# Patient Record
Sex: Male | Born: 1935 | Race: White | Hispanic: No | Marital: Married | Smoking: Former smoker
Health system: Southern US, Community
[De-identification: ages and names within clinical notes are randomized; demographics above are authoritative.]

## PROBLEM LIST (undated history)

## (undated) DIAGNOSIS — Z950 Presence of cardiac pacemaker: Secondary | ICD-10-CM

## (undated) DIAGNOSIS — N189 Chronic kidney disease, unspecified: Secondary | ICD-10-CM

## (undated) DIAGNOSIS — I7300 Raynaud's syndrome without gangrene: Secondary | ICD-10-CM

## (undated) DIAGNOSIS — F419 Anxiety disorder, unspecified: Secondary | ICD-10-CM

## (undated) DIAGNOSIS — I719 Aortic aneurysm of unspecified site, without rupture: Secondary | ICD-10-CM

## (undated) DIAGNOSIS — R55 Syncope and collapse: Secondary | ICD-10-CM

## (undated) DIAGNOSIS — I499 Cardiac arrhythmia, unspecified: Secondary | ICD-10-CM

## (undated) DIAGNOSIS — I10 Essential (primary) hypertension: Secondary | ICD-10-CM

## (undated) DIAGNOSIS — E785 Hyperlipidemia, unspecified: Secondary | ICD-10-CM

## (undated) DIAGNOSIS — I639 Cerebral infarction, unspecified: Secondary | ICD-10-CM

## (undated) DIAGNOSIS — K219 Gastro-esophageal reflux disease without esophagitis: Secondary | ICD-10-CM

## (undated) DIAGNOSIS — F329 Major depressive disorder, single episode, unspecified: Secondary | ICD-10-CM

## (undated) DIAGNOSIS — I714 Abdominal aortic aneurysm, without rupture, unspecified: Secondary | ICD-10-CM

## (undated) DIAGNOSIS — C801 Malignant (primary) neoplasm, unspecified: Secondary | ICD-10-CM

## (undated) DIAGNOSIS — F32A Depression, unspecified: Secondary | ICD-10-CM

## (undated) HISTORY — DX: Anxiety disorder, unspecified: F41.9

## (undated) HISTORY — DX: Abdominal aortic aneurysm, without rupture: I71.4

## (undated) HISTORY — DX: Essential (primary) hypertension: I10

## (undated) HISTORY — DX: Major depressive disorder, single episode, unspecified: F32.9

## (undated) HISTORY — DX: Chronic kidney disease, unspecified: N18.9

## (undated) HISTORY — PX: INSERT / REPLACE / REMOVE PACEMAKER: SUR710

## (undated) HISTORY — DX: Cerebral infarction, unspecified: I63.9

## (undated) HISTORY — PX: TONSILLECTOMY: SUR1361

## (undated) HISTORY — PX: OTHER SURGICAL HISTORY: SHX169

## (undated) HISTORY — DX: Depression, unspecified: F32.A

## (undated) HISTORY — PX: CHOLECYSTECTOMY: SHX55

## (undated) HISTORY — DX: Malignant (primary) neoplasm, unspecified: C80.1

## (undated) HISTORY — DX: Abdominal aortic aneurysm, without rupture, unspecified: I71.40

## (undated) HISTORY — DX: Hyperlipidemia, unspecified: E78.5

## (undated) HISTORY — DX: Presence of cardiac pacemaker: Z95.0

---

## 1998-03-05 ENCOUNTER — Other Ambulatory Visit: Admission: RE | Admit: 1998-03-05 | Discharge: 1998-03-05 | Payer: Self-pay | Admitting: Urology

## 1998-03-16 ENCOUNTER — Ambulatory Visit (HOSPITAL_COMMUNITY): Admission: RE | Admit: 1998-03-16 | Discharge: 1998-03-16 | Payer: Self-pay | Admitting: Urology

## 1998-04-02 ENCOUNTER — Encounter: Admission: RE | Admit: 1998-04-02 | Discharge: 1998-07-01 | Payer: Self-pay | Admitting: Radiation Oncology

## 1998-07-05 ENCOUNTER — Encounter: Admission: RE | Admit: 1998-07-05 | Discharge: 1998-10-03 | Payer: Self-pay | Admitting: Radiation Oncology

## 1998-07-21 ENCOUNTER — Encounter: Payer: Self-pay | Admitting: Urology

## 1998-07-24 ENCOUNTER — Encounter: Payer: Self-pay | Admitting: Urology

## 1998-07-24 ENCOUNTER — Ambulatory Visit (HOSPITAL_COMMUNITY): Admission: RE | Admit: 1998-07-24 | Discharge: 1998-07-25 | Payer: Self-pay | Admitting: Urology

## 1998-07-28 ENCOUNTER — Encounter: Payer: Self-pay | Admitting: Urology

## 1998-08-14 ENCOUNTER — Encounter: Payer: Self-pay | Admitting: Radiation Oncology

## 2001-10-24 ENCOUNTER — Inpatient Hospital Stay (HOSPITAL_COMMUNITY): Admission: EM | Admit: 2001-10-24 | Discharge: 2001-10-26 | Payer: Self-pay | Admitting: Emergency Medicine

## 2005-11-04 ENCOUNTER — Encounter: Admission: RE | Admit: 2005-11-04 | Discharge: 2005-11-04 | Payer: Self-pay | Admitting: Neurology

## 2006-08-03 ENCOUNTER — Emergency Department (HOSPITAL_COMMUNITY): Admission: EM | Admit: 2006-08-03 | Discharge: 2006-08-04 | Payer: Self-pay | Admitting: Emergency Medicine

## 2008-04-22 ENCOUNTER — Ambulatory Visit: Payer: Self-pay | Admitting: Vascular Surgery

## 2008-09-26 HISTORY — PX: CYSTOSCOPY: SUR368

## 2008-09-26 HISTORY — PX: OTHER SURGICAL HISTORY: SHX169

## 2008-09-30 ENCOUNTER — Encounter: Admission: RE | Admit: 2008-09-30 | Discharge: 2008-09-30 | Payer: Self-pay | Admitting: Vascular Surgery

## 2008-09-30 ENCOUNTER — Ambulatory Visit: Payer: Self-pay | Admitting: Vascular Surgery

## 2008-10-22 ENCOUNTER — Inpatient Hospital Stay (HOSPITAL_COMMUNITY): Admission: RE | Admit: 2008-10-22 | Discharge: 2008-10-24 | Payer: Self-pay | Admitting: Vascular Surgery

## 2008-10-22 ENCOUNTER — Ambulatory Visit: Payer: Self-pay | Admitting: Vascular Surgery

## 2008-11-04 ENCOUNTER — Ambulatory Visit: Payer: Self-pay | Admitting: Vascular Surgery

## 2008-11-25 ENCOUNTER — Ambulatory Visit: Payer: Self-pay | Admitting: Vascular Surgery

## 2008-11-25 ENCOUNTER — Encounter: Admission: RE | Admit: 2008-11-25 | Discharge: 2008-11-25 | Payer: Self-pay | Admitting: Vascular Surgery

## 2009-03-03 ENCOUNTER — Encounter: Admission: RE | Admit: 2009-03-03 | Discharge: 2009-03-03 | Payer: Self-pay | Admitting: Vascular Surgery

## 2009-03-03 ENCOUNTER — Ambulatory Visit: Payer: Self-pay | Admitting: Vascular Surgery

## 2009-06-09 ENCOUNTER — Ambulatory Visit: Payer: Self-pay | Admitting: Vascular Surgery

## 2009-07-09 ENCOUNTER — Inpatient Hospital Stay (HOSPITAL_COMMUNITY): Admission: AD | Admit: 2009-07-09 | Discharge: 2009-07-13 | Payer: Self-pay

## 2009-12-08 ENCOUNTER — Ambulatory Visit: Payer: Self-pay | Admitting: Vascular Surgery

## 2009-12-08 ENCOUNTER — Encounter: Admission: RE | Admit: 2009-12-08 | Discharge: 2009-12-08 | Payer: Self-pay | Admitting: Vascular Surgery

## 2009-12-16 ENCOUNTER — Encounter: Admission: RE | Admit: 2009-12-16 | Discharge: 2009-12-16 | Payer: Self-pay | Admitting: Vascular Surgery

## 2010-03-04 ENCOUNTER — Ambulatory Visit (HOSPITAL_COMMUNITY)
Admission: RE | Admit: 2010-03-04 | Discharge: 2010-03-05 | Payer: Self-pay | Source: Home / Self Care | Admitting: Vascular Surgery

## 2010-05-04 ENCOUNTER — Ambulatory Visit: Payer: Self-pay | Admitting: Vascular Surgery

## 2010-05-04 ENCOUNTER — Encounter: Admission: RE | Admit: 2010-05-04 | Discharge: 2010-05-04 | Payer: Self-pay | Admitting: Interventional Radiology

## 2010-07-05 IMAGING — CT CT ANGIO PELVIS
2 of 5 series · 15 of 46 positions shown, 17 images · IV contrast (80ML OMNI 350)
Comparison: 08/04/2006.

CTA ABDOMEN

CLINICAL DATA: Abdominal aortic aneurysm.

CT ANGIOGRAPHY OF ABDOMEN AND PELVIS WITHOUT AND/OR WITH CONTRAST-
PRESTENT PROTOCOL
TECHNIQUE: Multidetector CT imaging of the abdomen and pelvis was
performed before and during bolus injection of intravenous
contrast.  Multiplanar CT angiographic image reconstructions
including MIPs were also generated to evaluate the vascular
anatomy.
Contrast: 80 ml Hmnipaque-G99

[Series 4: angio · axial · 0.78mm/px · z∈[-436,-4]mm · 12 of 193 slices shown, 14 images]
[im 10/193  soft-tissue]
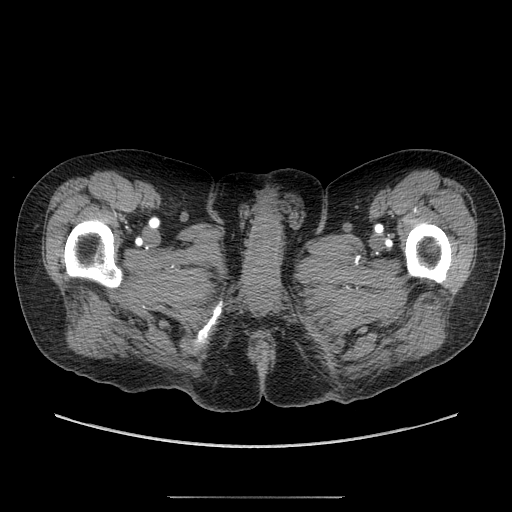
[im 10/193  bone]
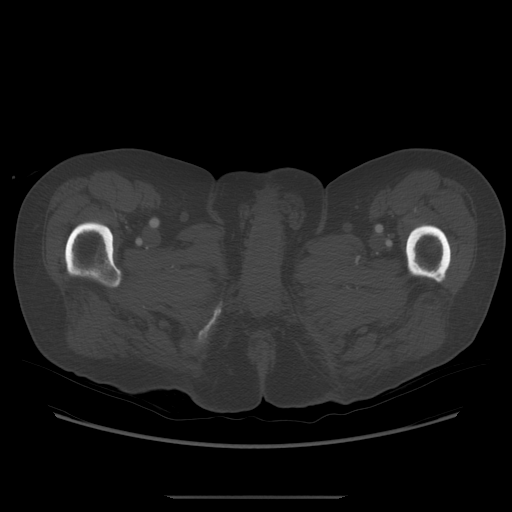
[im 29/193  soft-tissue]
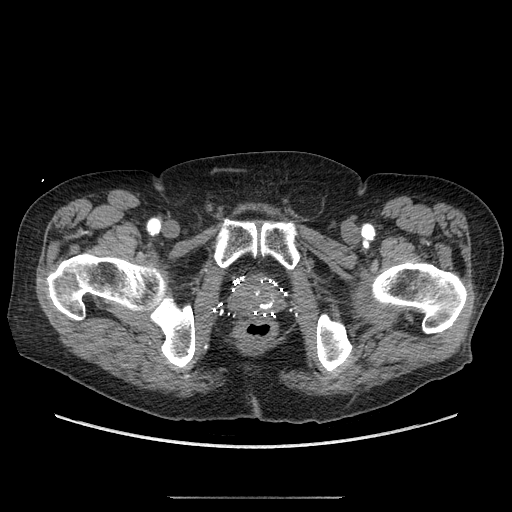
[im 39/193  soft-tissue]
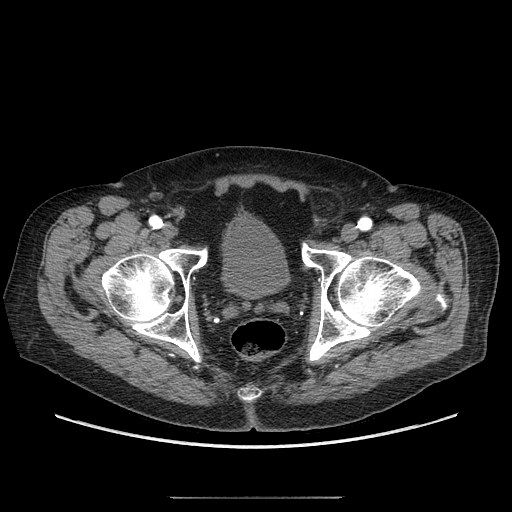
[im 58/193  soft-tissue]
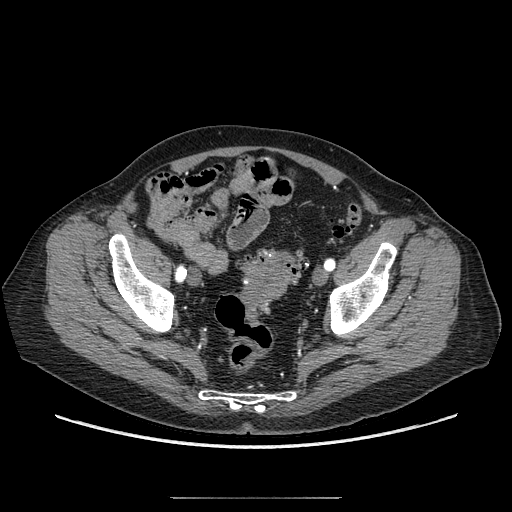
[im 77/193  soft-tissue]
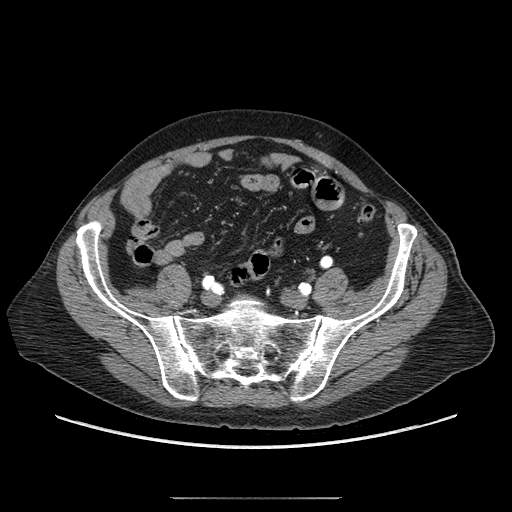
[im 87/193  soft-tissue]
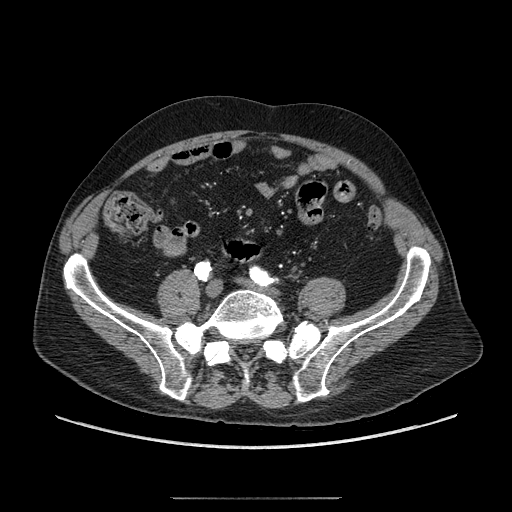
[im 106/193  soft-tissue]
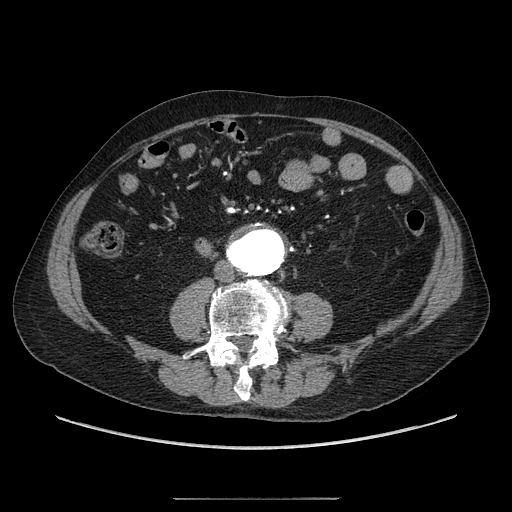
[im 116/193  soft-tissue]
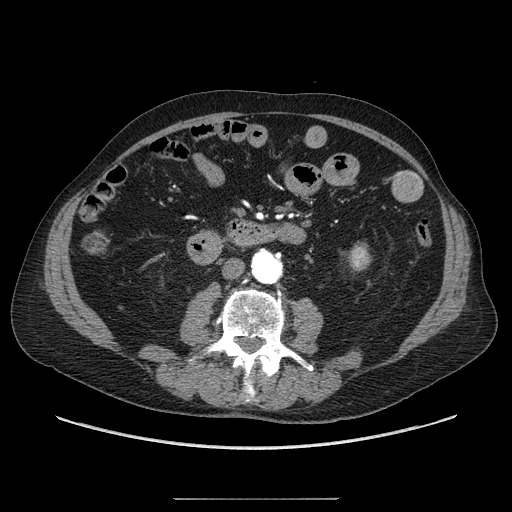
[im 135/193  soft-tissue]
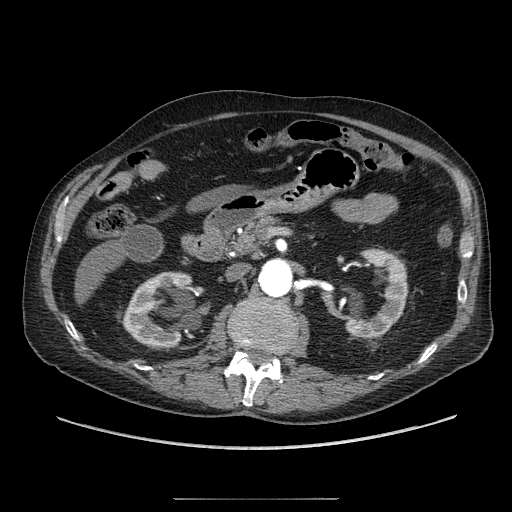
[im 135/193  bone]
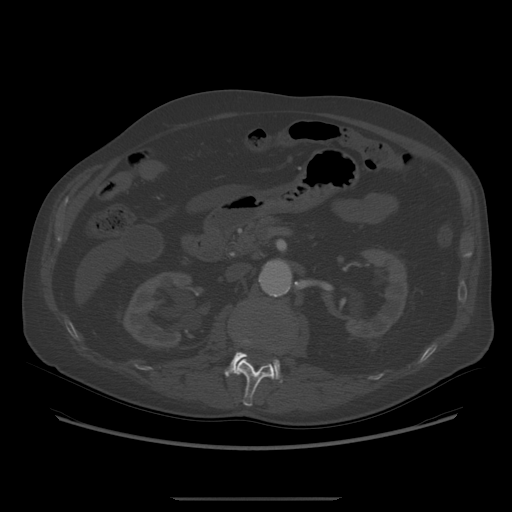
[im 154/193  soft-tissue]
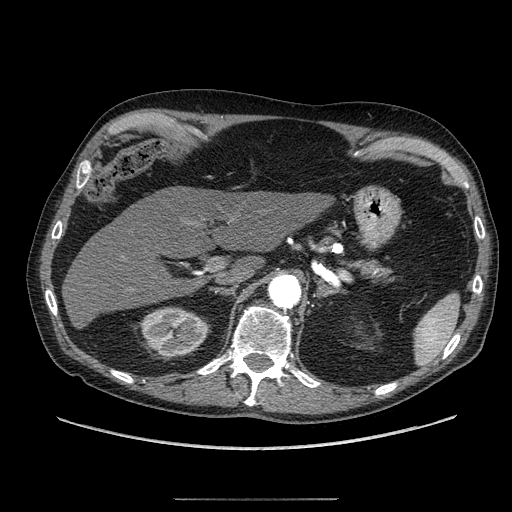
[im 164/193  soft-tissue]
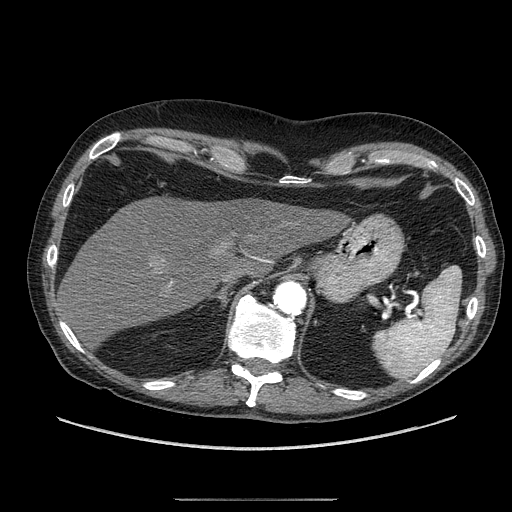
[im 183/193  soft-tissue]
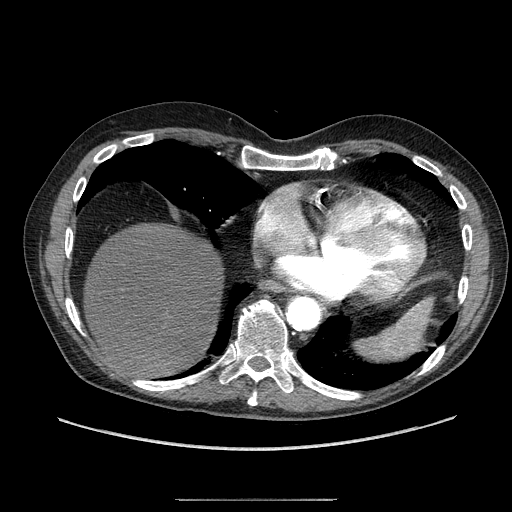

[Series 602: sagittal body · sagittal · 0.94mm/px · 3 of 160 slices shown]
[im 54/160  soft-tissue]
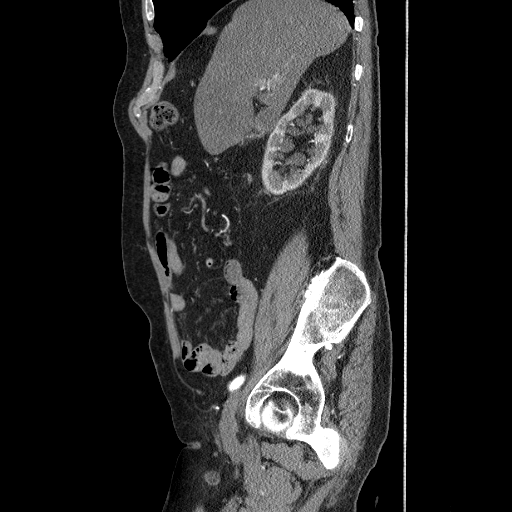
[im 71/160  soft-tissue]
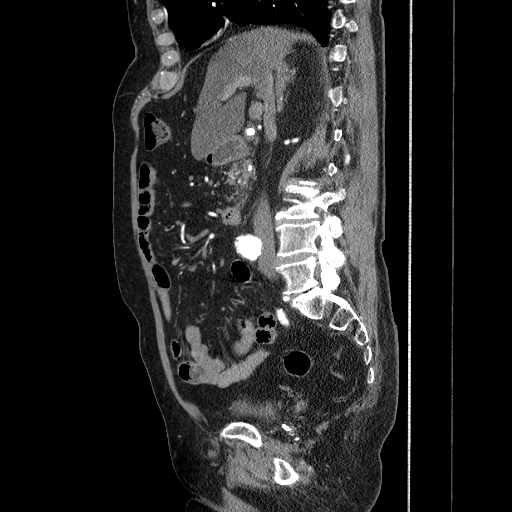
[im 89/160  soft-tissue]
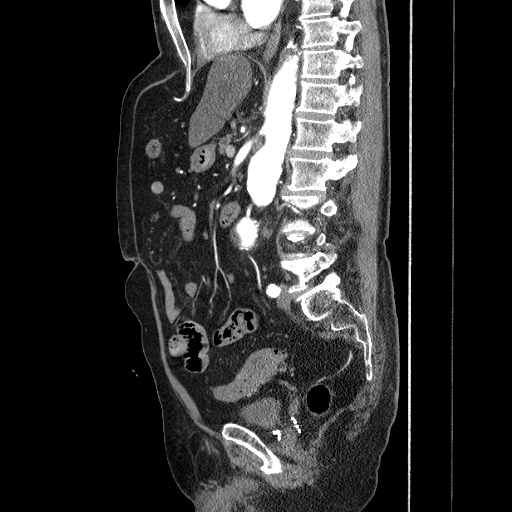

[15 of 46 positions shown; findings below may reference images not displayed]

FINDINGS: The visualized portions of the liver and spleen are
unremarkable.  The stomach, duodenum, gallbladder, adrenal glands,
and abdominal bowel loops are unremarkable.  Central sinus and
cortical cysts are seen in both kidneys.  Diffuse parenchymal
calcification within the pancreas is new in the interval and
suggests chronic pancreatitis.  A linear calcification in the tail
of the pancreas may be within the main pancreatic duct.  A similar
linear calcification is seen in the central pancreas near the
junction of the body and tail.  Diffuse prominence of the main
pancreatic duct is apparent

Number of renal arteries:  Right = 1; Left = 1

The abdominal aortic aneurysm has a bilobed or hourglass
configuration.  The abdominal aortic lumen measures 2.4 cm at the
level of the left renal artery (the more distal of the renal
arteries).  From the level of the left renal artery, the aortic
lumen then gradually enlarges so than at a distance of 1.3 cm from
left renal artery, the aortic lumen measures greater than 3 cm. The
luminal diameter is aneurysmal over a length of about 2.1 cm
(maximum diameter is 3.2 cm in this segment).  The lumen then
returns to a diameter of less than 3 cm at a distance of 3.4 cm
distal to the left renal artery.  6.7 cm from the left renal artery
it again enlarges into the aneurysmal range.  From this location
(6.7 cm distal to the left renal artery) the aneurysm extends into
the bifurcation.  This second larger aneurysmal dilatation measures
4.8 cm in maximum diameter.

In short, the bilobed aneurysm has a central non-aneurysmal
"waist".  The first, smaller fusiform aneurysm is 2.1 cm in length,
the waist measures 3.3 cm in length, and the second, more distal
fusiform aneurysm is 5.9 cm in length and ends at the bifurcation.
The entire bilobed aneurysmal process begins 1.3 cm from the left
renal artery and extends into the bifurcation for a total length of
11.3 cm.  The proximal common iliac arteries are aneurysmal, right
greater than left with the proximal right common iliac artery
measuring 3.4 cm in diameter at the ostium and then tapering
rapidly to a diameter of 1.3 cm.

The celiac axis, superior mesenteric artery, and inferior
mesenteric artery opacified normally.  There is some calcific
plaque at the origin of these vessels, but no evidence for
substantial proximal stenosis.  The common iliac and external iliac
arteries opacify normally.

 Diameter of infrarenal neck:  2.5-3.0 cm
Total length of aneurysm:  11.3 cm (see above)
Aneurysm ends at aortic bifurcation: Yes  If no, Distance from
aneurysm to bifurcation:
Greatest aneurysm diameter:  4.8 cm

Greatest common iliac artery diameters:  Right =3.4 cm at the
ostium with rapid tapering to a 1.3 cm; Left = 1.6 cm
Diameter of common iliac arteries just above iliac bifurcation:
Right = 1.3 cm; Left = 1.3 cm
Length of common iliac arteries:  Right = 7.0 cm; Left = 5.3 cm
IMPRESSION: Bilobed fusiform abdominal aortic aneurysm as above.

Features of chronic pancreatitis, new in the interval since the
9444 exam.

CTA PELVIS
FINDINGS: No evidence for intraperitoneal free fluid.
Brachytherapy seeds are seen in the region of the prostate gland.
Left inguinal hernia contains only fat.  No pelvic sidewall
lymphadenopathy.  Advanced diverticulosis noted in the sigmoid
colon without evidence for diverticulitis.  Bone windows show no
worrisome lytic or sclerotic osseous lesions.
IMPRESSION: No acute findings in the anatomic pelvis.

## 2010-08-24 ENCOUNTER — Ambulatory Visit: Payer: Self-pay | Admitting: Vascular Surgery

## 2010-08-24 ENCOUNTER — Encounter: Admission: RE | Admit: 2010-08-24 | Discharge: 2010-08-24 | Payer: Self-pay | Admitting: Vascular Surgery

## 2010-09-26 DIAGNOSIS — Z950 Presence of cardiac pacemaker: Secondary | ICD-10-CM

## 2010-09-26 HISTORY — DX: Presence of cardiac pacemaker: Z95.0

## 2010-10-17 ENCOUNTER — Encounter: Payer: Self-pay | Admitting: Interventional Radiology

## 2010-10-17 ENCOUNTER — Encounter: Payer: Self-pay | Admitting: Vascular Surgery

## 2010-12-13 LAB — PROTIME-INR: Prothrombin Time: 12.7 seconds (ref 11.6–15.2)

## 2010-12-13 LAB — BASIC METABOLIC PANEL
BUN: 21 mg/dL (ref 6–23)
BUN: 22 mg/dL (ref 6–23)
CO2: 25 mEq/L (ref 19–32)
Chloride: 107 mEq/L (ref 96–112)
Chloride: 99 mEq/L (ref 96–112)
Creatinine, Ser: 1.6 mg/dL — ABNORMAL HIGH (ref 0.4–1.5)
Glucose, Bld: 124 mg/dL — ABNORMAL HIGH (ref 70–99)
Glucose, Bld: 129 mg/dL — ABNORMAL HIGH (ref 70–99)
Potassium: 4 mEq/L (ref 3.5–5.1)
Potassium: 4.5 mEq/L (ref 3.5–5.1)

## 2010-12-13 LAB — CBC
HCT: 27.5 % — ABNORMAL LOW (ref 39.0–52.0)
HCT: 35.3 % — ABNORMAL LOW (ref 39.0–52.0)
MCHC: 33.9 g/dL (ref 30.0–36.0)
MCHC: 34 g/dL (ref 30.0–36.0)
MCV: 94.1 fL (ref 78.0–100.0)
MCV: 94.7 fL (ref 78.0–100.0)
Platelets: 103 10*3/uL — ABNORMAL LOW (ref 150–400)
Platelets: 136 10*3/uL — ABNORMAL LOW (ref 150–400)
RDW: 16.1 % — ABNORMAL HIGH (ref 11.5–15.5)
RDW: 16.4 % — ABNORMAL HIGH (ref 11.5–15.5)
WBC: 4.8 10*3/uL (ref 4.0–10.5)

## 2010-12-30 LAB — CARDIAC PANEL(CRET KIN+CKTOT+MB+TROPI)
CK, MB: 2.1 ng/mL (ref 0.3–4.0)
Relative Index: INVALID (ref 0.0–2.5)
Total CK: 48 U/L (ref 7–232)
Total CK: 63 U/L (ref 7–232)
Troponin I: 0.03 ng/mL (ref 0.00–0.06)
Troponin I: 0.03 ng/mL (ref 0.00–0.06)

## 2010-12-30 LAB — BASIC METABOLIC PANEL
BUN: 12 mg/dL (ref 6–23)
CO2: 23 mEq/L (ref 19–32)
CO2: 25 mEq/L (ref 19–32)
Calcium: 8.1 mg/dL — ABNORMAL LOW (ref 8.4–10.5)
Calcium: 8.5 mg/dL (ref 8.4–10.5)
Chloride: 100 mEq/L (ref 96–112)
Chloride: 99 mEq/L (ref 96–112)
Creatinine, Ser: 1.39 mg/dL (ref 0.4–1.5)
Creatinine, Ser: 1.39 mg/dL (ref 0.4–1.5)
Creatinine, Ser: 1.44 mg/dL (ref 0.4–1.5)
GFR calc Af Amer: 57 mL/min — ABNORMAL LOW (ref >60)
GFR calc Af Amer: 59 mL/min — ABNORMAL LOW (ref >60)
GFR calc Af Amer: 59 mL/min — ABNORMAL LOW (ref >60)
GFR calc Af Amer: >60 mL/min (ref >60)
GFR calc non Af Amer: 53 mL/min — ABNORMAL LOW (ref >60)
Potassium: 3.4 mEq/L — ABNORMAL LOW (ref 3.5–5.1)
Sodium: 129 mEq/L — ABNORMAL LOW (ref 135–145)

## 2010-12-30 LAB — CBC
HCT: 26.9 % — ABNORMAL LOW (ref 39.0–52.0)
HCT: 25.5 % — ABNORMAL LOW (ref 39.0–52.0)
HCT: 31.3 % — ABNORMAL LOW (ref 39.0–52.0)
Hemoglobin: 9.7 g/dL — ABNORMAL LOW (ref 13.0–17.0)
Hemoglobin: 9.7 g/dL — ABNORMAL LOW (ref 13.0–17.0)
MCHC: 34.2 g/dL (ref 30.0–36.0)
MCHC: 34.5 g/dL (ref 30.0–36.0)
MCHC: 34.6 g/dL (ref 30.0–36.0)
MCHC: 33.7 g/dL (ref 30.0–36.0)
MCHC: 34.2 g/dL (ref 30.0–36.0)
MCHC: 34.2 g/dL (ref 30.0–36.0)
MCV: 91.4 fL (ref 78.0–100.0)
MCV: 91.8 fL (ref 78.0–100.0)
MCV: 91.8 fL (ref 78.0–100.0)
MCV: 92.5 fL (ref 78.0–100.0)
MCV: 92.1 fL (ref 78.0–100.0)
MCV: 93.6 fL (ref 78.0–100.0)
Platelets: 109 10*3/uL — ABNORMAL LOW (ref 150–400)
Platelets: 146 10*3/uL — ABNORMAL LOW (ref 150–400)
Platelets: 101 10*3/uL — ABNORMAL LOW (ref 150–400)
Platelets: 130 10*3/uL — ABNORMAL LOW (ref 150–400)
Platelets: 98 10*3/uL — ABNORMAL LOW (ref 150–400)
RBC: 2.94 MIL/uL — ABNORMAL LOW (ref 4.22–5.81)
RBC: 2.97 MIL/uL — ABNORMAL LOW (ref 4.22–5.81)
RBC: 2.98 MIL/uL — ABNORMAL LOW (ref 4.22–5.81)
RBC: 3.03 MIL/uL — ABNORMAL LOW (ref 4.22–5.81)
RBC: 3.03 MIL/uL — ABNORMAL LOW (ref 4.22–5.81)
RBC: 3.09 MIL/uL — ABNORMAL LOW (ref 4.22–5.81)
RDW: 17.6 % — ABNORMAL HIGH (ref 11.5–15.5)
RDW: 17.9 % — ABNORMAL HIGH (ref 11.5–15.5)
RDW: 18 % — ABNORMAL HIGH (ref 11.5–15.5)
RDW: 18 % — ABNORMAL HIGH (ref 11.5–15.5)
RDW: 18.5 % — ABNORMAL HIGH (ref 11.5–15.5)
RDW: 18.6 % — ABNORMAL HIGH (ref 11.5–15.5)
RDW: 18.6 % — ABNORMAL HIGH (ref 11.5–15.5)
WBC: 4.4 10*3/uL (ref 4.0–10.5)
WBC: 5.3 10*3/uL (ref 4.0–10.5)
WBC: 5.3 10*3/uL (ref 4.0–10.5)
WBC: 6.4 10*3/uL (ref 4.0–10.5)
WBC: 3.7 10*3/uL — ABNORMAL LOW (ref 4.0–10.5)

## 2010-12-30 LAB — CULTURE, BLOOD (ROUTINE X 2)

## 2010-12-30 LAB — OSMOLALITY, URINE: Osmolality, Ur: 528 mOsm/kg (ref 390–1090)

## 2010-12-30 LAB — OSMOLALITY: Osmolality: 270 mOsm/kg — ABNORMAL LOW (ref 275–300)

## 2010-12-30 LAB — CHLORIDE, URINE, RANDOM: Chloride Urine: 119 mEq/L

## 2010-12-30 LAB — TYPE AND SCREEN: Antibody Screen: NEGATIVE

## 2010-12-30 LAB — GLUCOSE, CAPILLARY

## 2011-01-10 LAB — COMPREHENSIVE METABOLIC PANEL
AST: 23 U/L (ref 0–37)
AST: 34 U/L (ref 0–37)
Albumin: 2.5 g/dL — ABNORMAL LOW (ref 3.5–5.2)
Albumin: 3.7 g/dL (ref 3.5–5.2)
Calcium: 7.8 mg/dL — ABNORMAL LOW (ref 8.4–10.5)
Calcium: 9.5 mg/dL (ref 8.4–10.5)
Creatinine, Ser: 1.11 mg/dL (ref 0.4–1.5)
Creatinine, Ser: 1.28 mg/dL (ref 0.4–1.5)
GFR calc Af Amer: >60 mL/min (ref >60)
GFR calc Af Amer: >60 mL/min (ref >60)
Sodium: 136 mEq/L (ref 135–145)
Total Protein: 4.8 g/dL — ABNORMAL LOW (ref 6.0–8.3)

## 2011-01-10 LAB — URINALYSIS, ROUTINE W REFLEX MICROSCOPIC
Leukocytes, UA: NEGATIVE
Nitrite: NEGATIVE
Specific Gravity, Urine: 1.026 (ref 1.005–1.030)
pH: 5.5 (ref 5.0–8.0)

## 2011-01-10 LAB — BLOOD GAS, ARTERIAL
Acid-Base Excess: 0 mmol/L (ref 0.0–2.0)
Acid-base deficit: 0.4 mmol/L (ref 0.0–2.0)
Bicarbonate: 22.7 mEq/L (ref 20.0–24.0)
Drawn by: 270091
FIO2: 0.21 %
O2 Content: 6 L/min
O2 Saturation: 96.9 %
O2 Saturation: 98.4 %
pO2, Arterial: 99 mmHg (ref 80.0–100.0)

## 2011-01-10 LAB — TYPE AND SCREEN: Antibody Screen: NEGATIVE

## 2011-01-10 LAB — BASIC METABOLIC PANEL
BUN: 14 mg/dL (ref 6–23)
Calcium: 8.3 mg/dL — ABNORMAL LOW (ref 8.4–10.5)
Creatinine, Ser: 1.01 mg/dL (ref 0.4–1.5)
GFR calc non Af Amer: >60 mL/min (ref >60)
Potassium: 3.7 mEq/L (ref 3.5–5.1)

## 2011-01-10 LAB — CBC
MCHC: 33.8 g/dL (ref 30.0–36.0)
MCHC: 34.2 g/dL (ref 30.0–36.0)
MCV: 92.9 fL (ref 78.0–100.0)
MCV: 94.9 fL (ref 78.0–100.0)
Platelets: 148 10*3/uL — ABNORMAL LOW (ref 150–400)
Platelets: 93 10*3/uL — ABNORMAL LOW (ref 150–400)
Platelets: 99 10*3/uL — ABNORMAL LOW (ref 150–400)
RDW: 19.6 % — ABNORMAL HIGH (ref 11.5–15.5)
WBC: 4.5 10*3/uL (ref 4.0–10.5)
WBC: 5.8 10*3/uL (ref 4.0–10.5)
WBC: 7.2 10*3/uL (ref 4.0–10.5)

## 2011-01-10 LAB — APTT
aPTT: 26 seconds (ref 24–37)
aPTT: 28 seconds (ref 24–37)

## 2011-01-10 LAB — URINE MICROSCOPIC-ADD ON

## 2011-01-10 LAB — ABO/RH: ABO/RH(D): O POS

## 2011-01-10 LAB — PROTIME-INR
INR: 1 (ref 0.00–1.49)
Prothrombin Time: 13.9 seconds (ref 11.6–15.2)

## 2011-01-12 HISTORY — PX: PACEMAKER INSERTION: SHX728

## 2011-02-08 ENCOUNTER — Other Ambulatory Visit: Payer: Self-pay | Admitting: Vascular Surgery

## 2011-02-08 DIAGNOSIS — I714 Abdominal aortic aneurysm, without rupture, unspecified: Secondary | ICD-10-CM

## 2011-02-08 NOTE — Assessment & Plan Note (Signed)
OFFICE VISIT   Paul Fisher, Paul Fisher  DOB:  04-01-1924                                       03/03/2009  [id number]#:[id number]   The patient returns for further followup regarding his Gore aortic stent  graft which was placed 10/22/2008.  This was an aortobi-common iliac  graft.  He has a known small type 2 endoleak which appears to be arising  from either the middle sacral or lumbar artery.  The aneurysm sac today  again is no larger than it was preoperatively having decreased from 54  mm to 52 mm which is the same measurement as 3 months ago.  The type 2  endoleak again is visualized today but does not seem any change from  last visit by CT scan.  There is no evidence of migration of the graft  or fracture of the stents and it seems to be in excellent position.  He  denies any chest pain, dyspnea on exertion, claudication or neurologic  symptoms today.   PHYSICAL EXAM:  Blood pressure 169/78, heart rate 84, respirations 14.  The carotid pulses were 3+ with no bruits.  Neurologic exam is normal.  Chest clear to auscultation.  Abdomen soft, nontender.  No pulsatile  masses palpable.  He has 3+ femoral and popliteal pulses bilaterally  with 2+ posterior tibial pulses.   I discussed with him again that he does have a type 2 endoleak but that  we will continue to follow it closely but it is nothing to be concerned  about at this time because the aneurysm sac is not enlarging and the  leak is not increasing.  He will return again in 3 months with a duplex  scan to visualize this type 2 leak and to check the size of the aneurysm  sac.   Quita Skye Hart Rochester, M.D.  Electronically Signed   JDL/MEDQ  D:  03/03/2009  T:  03/04/2009  Job:  [id number]

## 2011-02-08 NOTE — Op Note (Signed)
NAME:  Paul Fisher, Paul Fisher                ACCOUNT NO.:  [id number]   MEDICAL RECORD NO.:  [id number]          PATIENT TYPE:  INP   LOCATION:  2030                         FACILITY:  [facility]   PHYSICIAN:  Juleen China IV, MDDATE OF BIRTH:  1924-08-04   DATE OF PROCEDURE:  10/22/2008  DATE OF DISCHARGE:                               OPERATIVE REPORT   PREOPERATIVE DIAGNOSIS:  Abdominal aortic aneurysm.   POSTOPERATIVE DIAGNOSIS:  Abdominal aortic aneurysm.   PROCEDURE PERFORMED:  1. Left common femoral artery exposure.  2. Please see Dr. Tenny Craw' operative note for full details of the      procedure.  This will be the dictation of the left groin.   ANESTHESIA:  General.   BLOOD LOSS:  Minimal.   COMPLICATIONS:  None.   PROCEDURE:  After successful induction of general anesthesia, the  patient was prepped and draped in usual fashion.  A time-out was called.  Antibiotics were given.  Oblique incision was made in the left groin  below the anatomical landmarks for the inguinal ligament.  Cautery was  used to divide the subcutaneous tissue.  The femoral sheath was opened  sharply.  The left common femoral artery was mobilized and Silastic  vessel loops were placed proximally and distally on the artery.  Access  into the artery was obtained with 18-gauge needle and a 0.035 wire.  Through the left groin, the contralateral limb was deployed.  Please see  details of the procedure from Dr. Candie Chroman  note.   After completion of successful endovascular exclusion of the aneurysm,  the left groin was sewed to be closed.  After removing the 12-French  sheath and wire, a Henley clamp was placed proximally on the artery and  a peripheral DeBakey clamp placed distally.  The artery was flushed  prior to clamping it.  The arteriotomy was closed with a running 5-0  Prolene.  One repair stitch was required.  Flow was successfully  reestablished through the left groin.  The wound was then irrigated.  Hemostasis was achieved after protamine was administered.  The femoral  sheath was reapproximated with 2-0 Vicryl.  The subcutaneous tissue was  closed in two additional layers of 2-0 Vicryl and one of 3-0 Vicryl.  The skin was closed with a 4-0 Vicryl.  Sterile dressings were applied.           ______________________________  V. Charlena Cross, MD  Electronically Signed     VWB/MEDQ  D:  10/23/2008  T:  10/24/2008  Job:  [id number]

## 2011-02-08 NOTE — Assessment & Plan Note (Signed)
OFFICE VISIT   Paul Fisher, Paul Fisher  DOB:  April 03, 1924                                       12/08/2009  MVHQI#:[id number]   The patient returns today for continued followup regarding his aortic  stent graft which Dr. Myra Gianotti and I placed on 10/22/2008 for an  infrarenal abdominal aortic aneurysm.  He has a known type 2 endo leak  which we have been following both by CT scanning and duplex scanning and  today I ordered a CT angiogram which I have reviewed and interpreted.  This reveals that he continues to have a type 2 endo leak which seems  more brisk today and appears to be originating from the inferior  mesenteric artery although this is not totally certain.  The maximum  diameter of the aneurysm was certainly no smaller and possibly has  enlarged a few millimeters to 5.6 cm in maximum diameter.  He has no  abdominal or back symptoms.   CHRONIC MEDICAL PROBLEMS:  1. Hypertension.  2. Hyperlipidemia.  3. Depression and anxiety.  4. Prostate cancer with seed implants.  5. History of two small strokes about 6 months ago.   FAMILY HISTORY:  Negative for coronary artery disease, diabetes or  stroke.   SOCIAL HISTORY:  He is married, has three children, is retired, has not  smoked since 1984.  Drinks occasional alcohol.   REVIEW OF SYSTEMS:  Negative for chest pain, dyspnea on exertion, PND,  orthopnea.  His gait is still unstable since his recent stroke.   PHYSICAL EXAM:  Vital signs:  Blood pressure 147/79, heart rate 100,  respirations 24.  General:  He is a well-developed, well-nourished male  who is in no apparent distress.  HEENT:  EOMs intact, conjunctivae  normal.  Chest:  Clear to auscultation.  No wheezing.  Cardiovascular:  Regular rhythm.  No murmurs.  Carotid pulses 3+ and no bruits.  Abdomen:  Soft, nontender with no pulsatile mass noted.  Extremity:  Exam reveals  3+ femoral, 2+ dorsalis pedis pulses.  Musculoskeletal:  Exam is free of  major  deformities.  Neurological:  Exam is normal.   He does have a type 2 endo leak which I have reviewed by CT scanning  with Dr. Myra Gianotti and we feel that he should have an attempt by  interventional radiology to directly access the aneurysm sac posteriorly  and attempt to cannulate the origin of the leak which is probably the  IMA.  I will discuss this with Dr. Irish Lack and try to schedule it  in the near future.     Quita Skye Hart Rochester, M.D.  Electronically Signed   JDL/MEDQ  D:  12/08/2009  T:  12/09/2009  Job:  [id number]

## 2011-02-08 NOTE — Discharge Summary (Signed)
NAME:  Paul Fisher, Paul Fisher                ACCOUNT NO.:  [id number]   MEDICAL RECORD NO.:  [id number]          PATIENT TYPE:  INP   LOCATION:  2030                         FACILITY:  [facility]   PHYSICIAN:  Quita Skye. Hart Rochester, M.D.  DATE OF BIRTH:  1923/10/06   DATE OF ADMISSION:  10/22/2008  DATE OF DISCHARGE:  10/24/2008                               DISCHARGE SUMMARY   FINAL DISCHARGE DIAGNOSES:  1. Abdominal aortic aneurysm.  2. Hypertension.  3. Anxiety/depression disorder.   PROCEDURE PERFORMED:  Endovascular repair of abdominal aortic aneurysm  by Dr. Hart Rochester on October 22, 2008.   COMPLICATIONS:  None.   CONDITION AT DISCHARGE:  Stable, improving.   DISCHARGE MEDICATIONS:  He is instructed to resume all previous  medications consisting of  1. Nexium 40 mg p.o. q.p.m.  2. Crestor 10 mg p.o. q. a.m.  3. Cymbalta 60 mg p.o. q.a.m.  4. Etodolac ER 500 mg p.o. q.a.m.  5. Altace 10 mg p.o. q.a.m.  6. Alprazolam 0.5 mg p.r.n.  7. Vitamin D injection, first of every month.  8. Vitamin B12 injection, first of every month.   He is given prescriptions for  1. Percocet 5/325 one p.o. q.4 h. p.r.n. pain, a total number of 20      was given.  2. Pyridium 100 mg p.o. t.i.d. for 2 days.  3. Flomax 0.4 mg p.o. nightly for 30 days.   DISPOSITION:  He is being discharged to home in stable condition after  receiving careful instructions regarding the care of his wounds, his  activity level, and his diet.  He is to return to see Dr. Hart Rochester in 4  weeks with ABIs and a CTA of the abdomen and pelvis with stent graft  protocol.  The appointments will be made by our office.   BRIEF IDENTIFYING STATEMENT:  For complete details, please refer to the  typed history and physical.  Briefly, this very pleasant 77 year old  gentleman was referred to Dr. Hart Rochester with an abdominal aortic aneurysm.  Dr. Hart Rochester evaluated him and recommended repair through an endovascular  approach.  He was informed of the risk and  benefits of the procedure and  after careful consideration, he elected to proceed with surgery.   HOSPITAL COURSE:  Preoperative workup was completed as an outpatient.  He was brought in through same-day surgery and underwent the  aforementioned endovascular repair of an abdominal aortic aneurysm by  Dr. Hart Rochester.  For complete details, please refer the typed operative  report.  The procedure was without complications.  He was returned to  the post anesthesia care unit, extubated.  Following stabilization, he  was transferred to a bed on a surgical Step-Down Unit.  He was observed  overnight and was stable and transferred to a bed on a surgical  convalescent floor.  His diet and activity levels were advanced.  He had  urinary resection and required I&O catheterization.  He was started on  Pyridium and Flomax for this problem, which resolved without further  catheterizations.  The following day on October 24, 2008, he was stable.  He was  desirous of discharge.  His family was agreeable to the  discharge, and he was discharged to home in stable condition on October 24, 2008.      Wilmon Arms, PA      Quita Skye Hart Rochester, M.D.  Electronically Signed    KEL/MEDQ  D:  10/24/2008  T:  10/25/2008  Job:  [id number]

## 2011-02-08 NOTE — Op Note (Signed)
NAME:  Paul Fisher, Paul Fisher                ACCOUNT NO.:  [id number]   MEDICAL RECORD NO.:  [id number]          PATIENT TYPE:  INP   LOCATION:  3303                         FACILITY:  [facility]   PHYSICIAN:  Quita Skye. Hart Rochester, M.D.  DATE OF BIRTH:  [birth year]   DATE OF PROCEDURE:  10/22/2008  DATE OF DISCHARGE:                               OPERATIVE REPORT   PREOPERATIVE DIAGNOSIS:  Infrarenal abdominal aortic aneurysm.   POSTOPERATIVE DIAGNOSIS:  Infrarenal abdominal aortic aneurysm.   OPERATIONS:  1. Bilateral femoral artery exposure, right side by Dr. Hart Rochester and      left side by Dr. Myra Gianotti.  2. Insertion of aorto-bi-common iliac Gore excluder stent graft using;      a.     31 mm x 14 mm x 13 cm main body (ipsilateral right).      b.     12 mm x 7 cm extender limb to the right common iliac artery.      c.     12 mm x 12 cm contralateral limb - left with completion       angiogram.   SURGEON:  Quita Skye. Hart Rochester, MD   Dr. Myra Gianotti will dictate the left femoral exposure.   ANESTHESIA:  General endotracheal.   PROCEDURE IN DETAIL:  The patient was taken to the operating room and  placed in the supine position at which time satisfactory general  endotracheal anesthesia was administered.  Abdomen and groins were  prepped with Betadine scrub and solution and draped in routine sterile  manner.  Anesthesia had inserted an arterial line and a sheath for blood  return if necessary.  Suprainguinal oblique incisions were made  bilaterally, carried down through subcutaneous tissue and the common  femoral arteries were dissected free from the inguinal ligament distally  and encircled with vessel loops.  Both vessels had posterior plaques but  were soft anteriorly and had excellent pulses.  Heparin 6000 units was  given intravenously.  A short 8 sheath was inserted on the right side  through the common femoral artery and a long 8 sheath on the left side.  Using a pigtail catheter for exchange, an Amplatz  wire was passed up the  right iliac system for deployment of the main body with the ipsilateral  side being on the right.  The patient was heparinized with 6000 units of  heparin and 31 mm x 14 mm x 13 cm Gore excluder bifurcated stent graft  was then positioned appropriately.  An angiogram through the pigtail  catheter which had been placed up the left side was performed to  localize the left and right renal arteries, left which was inferior.  Graft was then deployed just distal to left renal artery without  difficulty.  A retrograde right common iliac angiogram was performed  through the sheath on the right to localize the exact origin of the  internal iliac artery.  An extender limb on the right was then deployed  which was a 12 mm x 7 cm limb to extend this down to the distal common  iliac artery on the  right.  After this had been performed, attention was  turned to the contralateral left side.  Using the long 8-mm sheath and a  Kumpe catheter, the contralateral gate was cannulated with a Glidewire  and a 12-mm sheath advanced through this over a Amplatz wire.  The left  limb of the graft was selected after shooting a retrograde angiogram  through the left common iliac sheath to determine the length and a 12 mm  x 12 cm graft was selected.  It was then deployed to the distal left  common iliac artery.  All junctions were then gently inflated using a  Coda balloon and following this, a completion angiogram was performed  using a pigtail catheter.  This revealed some very slight blushing  coming from a few lumbars (type 2 leak) which was quite mild but no  other significant leaks were noted.  Following this, protamine was then  given to reverse the heparin after removal of all sheaths and  guidewires.  The arteries were repaired bilaterally with 6-0 Prolene,  Dr. Myra Gianotti repairing the left side and Dr. Hart Rochester repairing the right  side.  When this was completed and adequate hemostasis  achieved after  giving protamine, the wounds were closed in layers with Vicryl in a  subcuticular fashion.  Sterile dressing was applied.  The patient was  taken to the recovery room in satisfactory condition.      Quita Skye Hart Rochester, M.D.  Electronically Signed     JDL/MEDQ  D:  10/22/2008  T:  10/23/2008  Job:  [id number]

## 2011-02-08 NOTE — Assessment & Plan Note (Signed)
OFFICE VISIT   Paul Fisher, Paul Fisher  DOB:  01/15/1924                                       11/04/2008  ZOXWR#:[id number]   The patient had an aortic stent graft inserted by me on January 27.  He  was discharged on the second or third postoperative day doing well.  He  has had some discomfort adjacent to the right inguinal area with certain  movements of his legs but not consistently each time he moves.  He is  ambulating well, has a good appetite and has no other complaints.   PHYSICAL EXAMINATION:  Vital signs:  Blood pressure 157/86, heart rate  is 87.  Abdomen:  Soft.  No pulsatile mass is present.  Both inguinal  incisions are healing nicely.  There is some mild edema on the right  side but no hematoma or erythema or fluctuants.  He has excellent  popliteal pulses bilaterally.   I have reassured him regarding these findings and he will return March  2nd for CT angiogram that day as previously scheduled.   Quita Skye Hart Rochester, M.D.  Electronically Signed   JDL/MEDQ  D:  11/04/2008  T:  11/05/2008  Job:  [id number]

## 2011-02-08 NOTE — Consult Note (Signed)
VASCULAR SURGERY CONSULTATION   Paul Fisher, Paul Fisher  DOB:  [birth year]                                       04/22/2008  EAVWU#:[id number]   The patient was referred by Dr. [name] for vascular consultation for an  infrarenal abdominal aortic aneurysm.  This 77 year old gentleman states  that a small aneurysm was discovered in his infrarenal aorta several  years ago by Dr. Jethro Bolus.  He has had some followup with  ultrasounds and in December of 2008 apparently this revealed it to be  3.7 cm in maximum diameter.  CT scan performed on 03/19/2008 revealed  the aneurysm to be 4.9 cm in maximum diameter.  He denies any recent  abdominal or back symptoms and is referred for further evaluation.   PAST MEDICAL HISTORY:  1. Hypertension.  2. Hyperlipidemia.  3. Depression and anxiety.  4. Prostate cancer with seed implants.  5. Negative for coronary artery disease, COPD or stroke.   PAST SURGICAL HISTORY:  The seed implantation for the prostate cancer.   FAMILY HISTORY:  Is negative for coronary artery disease, diabetes or  stroke.   SOCIAL HISTORY:  He is married and has three children and is retired.  He has not smoked since 1984.  Drinks occasional alcohol.   REVIEW OF SYSTEMS:  Is unremarkable.  Denying any cardiac symptoms,  lower extremity claudication symptoms, hemispheric or nonhemispheric  TIAs.   ALLERGIES:  Penicillin.   PHYSICAL EXAMINATION:  Vital signs:  Blood pressure is 164/80, heart  rate is 84, respirations 14.  General:  He is a healthy-appearing  elderly male in no apparent stress.  Alert and oriented x3.  Neck:  Supple, 3+ carotid pulses palpable.  No bruits are audible.  Neurological:  Normal.  No palpable adenopathy in the neck.  No skin  rashes are noted.  Upper extremity pulses are 3+ bilaterally.  Chest:  Clear to auscultation.  Cardiovascular:  Regular rhythm with no murmurs.  Abdomen:  Abdomen is soft with a small pulsatile mass in  mid  epigastrium.  This is nontender.  He has 3+ femoral, popliteal and  dorsalis pedis pulses bilaterally.   I reviewed the CT scan today and he does have a bilobed infrarenal  aortic aneurysm which appears to have a long infrarenal neck.  Maximum  diameter is slightly less than 5 cm.  Vessels are calcified in the iliac  areas.   I am not certain that the aneurysm has grown from 3.7 to 4.9 in 8 months  since one study was an ultrasound and the other was a CT scan but  certainly it needs to be watched closely.  I discussed this with him and  his daughter.  I will see him in 6 months with a CT angiogram done at  [facility] to see if he is a candidate for aortic stent  grafting and also to confirm the diameter compared with a recent CT scan  at [facility].  If he develops any abdominal, back or flank symptoms he  will report to the emergency room immediately.   Quita Skye Hart Rochester, M.D.  Electronically Signed  JDL/MEDQ  D:  04/22/2008  T:  04/23/2008  Job:  [id number]   cc:   Wannetta Sender.

## 2011-02-08 NOTE — H&P (Signed)
HISTORY AND PHYSICAL EXAMINATION   September 30, 2008   Re:  Paul Fisher, SOUDER                  DOB:  [birth year]   CHIEF COMPLAINT:  Infrarenal abdominal aortic aneurysm.   HISTORY OF PRESENT ILLNESS:  This 77 year old male patient was found to  have an abdominal aortic aneurysm by Dr. Alexis Frock several years  ago.  This has been measured intermittently over the years and was  recently found in July of 2009 to have enlargement of the aneurysm from  3.7 to 4.9 cm.  A CT angiogram performed 09/30/2008 reveals the aneurysm  to be slightly larger than 5 cm in diameter in the infrarenal aortic  location.  He is now admitted for a aortic stent grafting.   PAST MEDICAL HISTORY:  1. Hypertension.  2. Hyperlipidemia.  3. Depression and anxiety.  4. Prostate cancer with seed implants.  5. Negative for coronary artery disease, COPD, or stroke.   PREVIOUS SURGERY:  Includes the seed implantation for prostate cancer.   FAMILY HISTORY:  Negative for coronary artery disease, diabetes, or  stroke.   SOCIAL HISTORY:  He is married, has 3 children, is retired.  He has not  smoked since 1984.  Drinks occasional alcohol.   REVIEW OF SYSTEMS:  Unremarkable.  Denies any chest pain, dyspnea on  exertion, PND, orthopnea, weight loss, anorexia.  No lower extremity  claudication symptoms, hemispheric or non-hemispheric TIAs, amaurosis  fugax, diplopia, blurred vision, or syncope.   ALLERGIES:  Penicillin.   MEDICATION:  1. Crestor 10 mg daily.  2. Cymbalta 60 mg daily.  3. Etodolac ER 500 mg daily.  4. Altace 10 mg daily.  5. Nexium 40 mg daily.  6. Alprazolam 0.5 mg daily.  7. Vitamin D 50 mcg daily.   PHYSICAL EXAM:  Blood pressure 180/85.  Heart rate 52.  Respirations are  18.  General:  This is a healthy-appearing male who appears his stated  age. He is alert and oriented x3.  Neck:  Supple, 3+ carotid pulses  palpable.  No bruits are audible.  Neurologic Exam:  Normal.   No  palpable adenopathy in the neck.  Chest:  Clear to auscultation.  Cardiovascular Exam:  Regular rhythm.  No murmurs.  Abdomen:  Soft,  nontender with a pulsatile mass in the mid epigastrium approximating 5  cm which is nontender.  He has 3+ femoral, popliteal, and dorsalis pedis  pulses bilaterally.   IMPRESSION:  1. Infrarenal abdominal aortic aneurysm.  2. Hypertension.  3. Hyperlipidemia.  4. History of prostate cancer.   PLAN:  Is to admit the patient on 10/22/2008 for endovascular repair of  an abdominal aortic aneurysm with Gore stent graft.  Risks and benefits  have been thoroughly discussed with him and his wife and they would like  to proceed.   Quita Skye Hart [place], M.D.  Electronically Signed   JDL/MEDQ  D:  09/30/2008  T:  10/01/2008  Job:  [id number]   cc:   Lynelle Smoke I. Patsi Sears, M.D.  Wannetta Sender.

## 2011-02-08 NOTE — Procedures (Signed)
ENDOVASCULAR STENT GRAFT EXAM   INDICATION:  Abdominal aortic aneurysm stent with type 2 endo leak.   HISTORY:  Abdominal aortic aneurysm stent repair on 10/22/2008.                           DUPLEX EVALUATION   AAA Sac Size:                 4.4 CM AP             4.3 CM TRV  Previous Sac Size:             5.2 CM AP            4.4 CM TRV  Evidence of an endoleak?      Yes                   yes   Velocity Criteria:  Proximal Aorta                66 cm/sec  Proximal Stent Graft          68 cm/sec  Main Body Stent Graft-Mid     74 cm/sec  Right Limb-Proximal           65 cm/sec  Right Limb-Distal             81 cm/sec  Left Limb-Proximal            54 cm/sec  Left Limb-Distal              70 cm/sec  Patent Renal Arteries?        Yes                   Yes   IMPRESSION:  1. Patent abdominal aortic aneurysm stent with mild flow noted within      the aneurysmal sac which appears consistent with the type 2 endo      leak seen on a  previous CT on 05/01/2009.  No increase in the      aneurysm sac size noted.  2. Normal bilateral ankle brachial indices.  3. Limited visualization of the abdominal aorta due to overlying bowel      gas patterns.    ___________________________________________  Quita Skye Hart [place], M.D.   CH/MEDQ  D:  06/09/2009  T:  06/10/2009  Job:  [id number]

## 2011-02-08 NOTE — Assessment & Plan Note (Signed)
OFFICE VISIT   SOL, ENGLERT  DOB:  1924/02/05                                       11/25/2008  JYNWG#:[id number]   The patient has infrarenal aneurysm treated with aortic stent graft -  excluder on January 27 with aortobi common iliac graft.  He has done  very well since his procedure.  He was seen a few weeks ago with some  discomfort in the right inguinal area which has essentially resolved.  He has had no abdominal pain.  He has had a good appetite  and has been  increasing his ambulation although he is somewhat weak.   On exam today blood pressure 170/77, heart rate 77, respirations 18.  Abdomen:  Soft, nontender with no pulsatile mass palpable.  No bruits  are heard.  He has 3+ femoral and 3+ dorsalis pedis pulse bilaterally.  Both inguinal incisions are healed nicely.   CT angiogram today did reveal a small type 2 leak which could be arising  from a middle sacral artery or a lumbar artery.  The aneurysm sac is no  larger than it was preoperatively and does seem to be stable.  There is  no evidence of a type 1 leak.  I discussed this at length with him and  his wife and the fact that we will need to repeat the scans more  frequently to monitor this to make sure the aneurysm sac is not  enlarging.  He will return in 3 months with a CT angiogram performed at  that time.   Quita Skye Hart [place], M.D.  Electronically Signed   JDL/MEDQ  D:  11/25/2008  T:  11/26/2008  Job:  [id number]

## 2011-02-08 NOTE — Assessment & Plan Note (Signed)
OFFICE VISIT   TAEQUAN, STOCKHAUSEN  DOB:  [birth year]                                       06/09/2009  ZOXWR#:[id number]   The patient returns today for followup regarding his abdominal aortic  aneurysm stent graft.  He has a type 2 endo leak which was noted on his  CT scan when I saw him in June of this year.  Since I saw him he did  have a small stroke which was documented by MRI scan which affected his  ambulation slightly although he is recovering from that.  He is not  having any abdominal or back symptoms and has been stable otherwise.   PHYSICAL EXAMINATION:  On exam today blood pressure 134/69, heart rate  83, respirations 14.  He has 3+ carotid pulses bilaterally.  His abdomen  is soft with no pulsatile mass noted.  He has 3+ femoral and popliteal  pulses bilaterally with well-perfused lower extremities.   A duplex scan of the graft was performed in the office today which shows  the sac size to be 4.4 x 4.3 cm which is decreased from the CT scan in  June of this year.  There does appear to be some very slight flow in the  aneurysm sac consistent with a type 2 endo leak.  I do not think the  endo leak has changed and we will see him in 6 months with CT angiogram  to further assess this unless he has any problems in the interim.   Quita Skye Hart [place], M.D.  Electronically Signed   JDL/MEDQ  D:  06/09/2009  T:  06/10/2009  Job:  [id number]

## 2011-02-08 NOTE — Assessment & Plan Note (Signed)
OFFICE VISIT   MICA, RELEFORD  DOB:  Feb 14, 1924                                       05/04/2010  EAVWU#:[id number]   The patient returns today for further followup regarding his aortic  stent graft which was inserted in January of 2010.  He did have a type 2  leak arising from his inferior mesenteric artery and possibly a lumbar  branch.  This was treated by Dr. Fredia Sorrow in June of this year with  embolization coils and glue successfully.  Today he had a CT angiogram  performed which I have reviewed and Dr. Fredia Sorrow has reviewed.  This  reveals that the aneurysm sac has decreased in size from 5.6 to 4.8 cm  and the type 2 leak is no longer present.  Otherwise stent graft also  looks to be in excellent position.  He denies any chest pain or dyspnea  on exertion but does have continued instability with ambulation.   CHRONIC MEDICAL PROBLEMS:  1. Hypertension.  2. Hyperlipidemia.  3. Depression and anxiety.  4. Prostate cancer with seed implants.  5. History of two small strokes.   FAMILY HISTORY:  Negative for coronary artery disease, diabetes and  stroke.   REVIEW OF SYSTEMS:  Is negative for chest pain, dyspnea on exertion,  PND, orthopnea.  Does have instability of gait as noted.  All other  systems negative.   PHYSICAL EXAMINATION:  Vital signs:  Blood pressure 179/78, heart rate  82, respirations 14.  General:  He is an elderly male who is in no  apparent distress, alert and oriented times 3.  HEENT:  Exam  unremarkable.  EOMs intact.  Lungs:  Clear to auscultation.  No rhonchi  or wheezing.  Cardiovascular:  Regular rhythm.  No murmurs.  Abdomen:  Is soft, no pulsatile masses noted.  No tenderness is noted.  He has 3+  femoral pulses bilaterally with well-perfused lower extremities.   I feel that his aneurysm is stable and certainly contracting with no  evidence of any type 2 endo leak at this time.  We will see him in 1  year with a CT  angiogram to be performed at that time for further  followup.  If that continues to look good will then alternate ultrasound  studies for followup.     Quita Skye Hart [place], M.D.  Electronically Signed   JDL/MEDQ  D:  05/04/2010  T:  05/05/2010  Job:  [id number]

## 2011-02-08 NOTE — Assessment & Plan Note (Signed)
OFFICE VISIT   Paul Fisher, STATEN  DOB:  Aug 30, 1924                                       08/24/2010  UEAVW#:[id number]   The patient returns today for continued followup regarding his aortic  stent graft which was inserted by me in January of 2010.  He had a type  2 endoleak which was followed for about 1 year before Dr. Fredia Sorrow  performed embolization with coils and glue into the aneurysm sac and  this has resolved the problem.  He has had no abdominal or back symptoms  and Dr. Fredia Sorrow performed this procedure in June of this year.  Today I  ordered a CT angiogram which I have reviewed by computer.  The aneurysm  sac continues to be about 5 cm in diameter compared to 5.6  preoperatively.  Stent graft is in excellent position.  There is no  evidence of any type 2 endoleak persisting.   CHRONIC MEDICAL PROBLEMS:  1. Hypertension.  2. Hyperlipidemia.  3. Depression and anxiety.  4. Prostate cancer with seed implants.  5. History of two small strokes.   FAMILY HISTORY:  Negative for coronary artery disease, diabetes and  stroke.   REVIEW OF SYSTEMS:  Negative for chest pain, dyspnea on exertion, does  have weakness in his lower extremities which has been present for a few  years.  Denies any other specific findings on review of systems.   PHYSICAL EXAM:  Vital signs:  Blood pressure 128/70, heart rate 96,  respirations 20.  General:  He is an elderly male in no apparent  distress, alert and oriented x3.  HEENT:  Normal for age.  EOMs intact.  Lungs:  Clear to auscultation.  No rhonchi or wheezing.  Cardiovascular:  Regular rhythm, no murmurs.  Carotid pulses 3+, no bruits.  Abdomen:  Soft, nontender, no pulsatile mass noted.  Musculoskeletal:  Free of  major deformities.  Neurologic:  Reveals no focal abnormalities, lower  extremity exam reveals 3+ femoral pulses bilaterally.   In general I think he is getting along well with no evidence of endoleak  at  the present time.  We will see him back in a year with CT angiogram  to further follow this stent graft unless he has any new symptoms in the  interim.     Quita Skye Hart [place], M.D.  Electronically Signed   JDL/MEDQ  D:  08/24/2010  T:  08/24/2010  Job:  [id number]

## 2011-02-11 NOTE — Consult Note (Signed)
[place]  Patient:    Paul Fisher, Paul Fisher Visit Number: [id number] MRN: [id number]          Service Type: MED Location: [phone] Attending Physician:  Jetty Duhamel T Dictated by:   Darci Needle, M.D. Proc. Date: 10/25/01 Admit Date:  10/24/2001   CC:         Patients room   Consultation Report  REFERRING PHYSICIAN:  Anastasio Auerbach, M.D.  REASON FOR CONSULTATION:  Syncope.  IMPRESSION: 1. Syncope, uncertain etiology.  Sequence of events was reviewed and could    potentially be cardiogenic.  Rule out sick sinus syndrome producing    bradyarrhythmia.  Rule out neurally mediated syncope. 2. Abnormal heart rhythm due to ectopic atrial rhythm (wandering atrial    pacemaker versus normal sinus rhythm with premature atrial contractures). 3. Abnormal electrocardiogram, inferior Q-waves.  RECOMMENDATIONS: 1. Monitor x 48 to 72 hours to look for evidence of excessive bradycardia, AV    block, sinus arrest, or other significant arrhythmias that may cause    hemodynamic consequences. 2. A 2-D echocardiogram to rule out regional wall motion abnormality.    Probably needs at least an Adenosine-Cardiolite study to rule out ischemic    heart disease as a background problem given the patients EKG abnormality. 3. Withhold any medications that can cause decrease heart rate.  HISTORY OF PRESENT ILLNESS:  The patient is a very pleasant 77 year old gentleman who has had two episodes of syncope or near syncope.  The first episode occurred this past summer or early fall.  He was at a football game and suddenly started to feel hot.  He got up to leave the game, got very weak, felt diaphoretic, and nearly fainted but some bystanders helped him to the ground and he gradually improved.  He received IV fluids and was admitted to the hospital in [place] but no significant abnormalities were found.  Yesterday, in his kitchen, he suddenly began to feel weak as  though he was going to fall. There was no nausea, chest pain, tachypalpitations, or other complaints.  He had his back to a wall, leaning against the wall, and gradually slid down the wall.  This was witnessed by his wife.  She states that he was pale.  He does not feel that he totally, completely lost consciousness.  The son got to the house shortly after the events started.  He states his father was staring outward in a glazed eye type appearance.  He eventually had snoring-like noises and a few twitches and had urinary incontinence.  EMS came and documented the heart rate in the 50s and he was transported to the hospital.  The patient remained sluggishly lucid throughout the entire episode except during the situation where there was heavy breathing and urinary incontinence.  This lasted approximately 10 seconds.  Since admission to the hospital, he has felt well.  He has had no dizziness or lightheadedness.  For the several days prior to the episode, he had been experiencing dizziness if he would bend forward.  He characterized this as being more of a vertiginous type of instability.  His significant medical problems include history of prostate cancer in 1998. Abdominal aortic aneurysm diagnosed in 1998.  Osteoarthritis.  Raynauds syndrome.  Daily alcohol use.  ALLERGIES:  PENICILLIN.  MEDICATIONS:  Altace, Lipitor, Lodine, aspirin, and possibly meclozine.  FAMILY HISTORY:  No sudden death, myocardial infarction history.  HABITS:  Quit smoking in 1985.  Drinks at least two  drinks of bourbon per day.  REVIEW OF SYSTEMS:  Not helpful.  PHYSICAL EXAMINATION:  GENERAL:  The patient is in no acute distress.  He has a somewhat ruddy appearance to his skin, particularly in the face.  NECK:  Does not review carotid bruits.  No JVD is noted with the patient lying in 45 degrees.  LUNGS:  Clear.  CARDIOVASCULAR:  Irregular rhythm but no obvious click or gallop is heard. PMI is  not laterally displaced.  ABDOMEN:  Soft.  Liver and spleen are not palpable.  Bowel sounds are normal.  EXTREMITIES:  No edema.  PULSES:  Radial and posterior tibial pulses are symmetric bilaterally.  NEUROLOGICAL:  Unremarkable.  LABORATORY DATA:  EKGs on October 24, 2001 demonstrates small inferior Q-waves, normal sinus rhythm with PACs.  IMPRESSION:  The patients history strikes me as being consistent with cerebral hypoperfusion.  Possible explanations could include neurally mediated syncope, sick sinus syndrome with sinus arrest, or severe sinus bradycardia, or unidentified tachyarrhythmia (I doubt this).  The history, however, does not specifically totally exclude the causes of syncope such as neurologic or metabolic causes.  Recommendations are listed above.  I will continue to follow the patient with you.  Given his appearance of his EKGs and the presence of an abdominal aortic aneurysm, he should be screened for evidence of significant myocardial ischemia that could fuel sudden potentially ischemic arrhythmias. Dictated by:   Darci Needle, M.D. Attending Physician:  Jetty Duhamel T DD:  10/25/01 TD:  10/25/01 Job: [id number]

## 2011-05-06 ENCOUNTER — Encounter: Payer: Self-pay | Admitting: Vascular Surgery

## 2011-05-10 ENCOUNTER — Ambulatory Visit: Payer: Self-pay | Admitting: Vascular Surgery

## 2011-05-10 ENCOUNTER — Other Ambulatory Visit: Payer: Self-pay

## 2011-05-17 ENCOUNTER — Other Ambulatory Visit: Payer: Self-pay

## 2011-05-17 ENCOUNTER — Ambulatory Visit: Payer: Self-pay | Admitting: Vascular Surgery

## 2011-05-24 ENCOUNTER — Encounter: Payer: Self-pay | Admitting: Vascular Surgery

## 2011-05-24 ENCOUNTER — Ambulatory Visit (INDEPENDENT_AMBULATORY_CARE_PROVIDER_SITE_OTHER): Payer: Medicare Other | Admitting: Vascular Surgery

## 2011-05-24 ENCOUNTER — Ambulatory Visit
Admission: RE | Admit: 2011-05-24 | Discharge: 2011-05-24 | Disposition: A | Discharge status: Home / Self Care | Payer: Medicare Other | Source: Ambulatory Visit | Attending: Vascular Surgery | Admitting: Vascular Surgery

## 2011-05-24 VITALS — BP 175/95 | HR 84 | Temp 97.7°F | Wt 162.0 lb

## 2011-05-24 DIAGNOSIS — I714 Abdominal aortic aneurysm, without rupture, unspecified: Secondary | ICD-10-CM

## 2011-05-24 MED ORDER — IOHEXOL 350 MG/ML SOLN
75.0000 mL | Freq: Once | INTRAVENOUS | Status: AC | PRN
  Administered 2011-05-24: 75 mL via INTRAVENOUS

## 2011-05-24 NOTE — Progress Notes (Signed)
Subjective:     Patient ID: Paul Fisher, male   DOB: Jun 28, 1924, 77 y.o.   MRN: [id number]  HPI this 77 year old male patient is followed for previous aortic stent graft placement in January of 2010. He had a persistent type II endoleak which required coil embolization of his inferior mesenteric artery by Dr.Yamagata and injection of glue into the aneurysm sac. Today he had a CT angiogram which I have reviewed by computer. There continues to be a small endoleak from a lumbar on the left postero-lateral wall. There is no flow coming from the inferior mesenteric artery it appears. The size of the aneurysm sac remains stable at about 5 cm. He denies any abdominal or back pain  Past Medical History  Diagnosis Date  . Hyperlipidemia   . Hypertension   . Depression   . Anxiety   . Cancer prostate  . Stroke   . Pacemaker 09/2010    Dr. Dani [place]    History  Substance Use Topics  . Smoking status: Former Smoker    Types: Cigarettes    Quit date: 05/05/1993  . Smokeless tobacco: Not on file  . Alcohol Use: Yes     occasional    No family history on file.  Allergies  Allergen Reactions  . Penicillins     Current outpatient prescriptions:aspirin 81 MG EC tablet, Take 81 mg by mouth daily.  , Disp: , Rfl: ;  Cyanocobalamin (VITAMIN B-12 IJ), Inject as directed every 30 (thirty) days.  , Disp: , Rfl: ;  DULoxetine (CYMBALTA) 60 MG capsule, Take 60 mg by mouth daily.  , Disp: , Rfl: ;  esomeprazole (NEXIUM) 40 MG capsule, Take 40 mg by mouth daily before breakfast.  , Disp: , Rfl:  Nutritional Supplements (VITAMIN D MAINTENANCE PO), Take by mouth 3 (three) times daily.  , Disp: , Rfl: ;  rosuvastatin (CRESTOR) 10 MG tablet, Take 10 mg by mouth daily.  , Disp: , Rfl:  No current facility-administered medications for this visit. Facility-Administered Medications Ordered in Other Visits: iohexol (OMNIPAQUE) 350 MG/ML injection 75 mL, 75 mL, Intravenous, Once PRN, Medication Radiologist, 75  mL at 05/24/11 1008  BP 175/95  Pulse 84  Temp(Src) 97.7 F (36.5 C) (Oral)  Wt 162 lb (73.483 kg)  There is no height on file to calculate BMI.        Review of Systems patient denies any chest pain dyspnea on exertion PND orthopnea or lower extremity claudication symptoms. He complains of generalized weakness and did have a pacemaker inserted in January of this year in [place]. No other specific complaints and review of systems.     Objective:   Physical Exam blood pressure 175/95 heart rate 84 respirations 16 Gen. he is an elderly male who appears frail he is in no apparent stress he's alert and oriented Chest clear to auscultation no rhonchi or wheezing Cardiovascular exam regular rhythm and no murmurs carotid pulses 3+ no audible bruits Abdomen soft nontender no pulsatile masses noted Lower extremity exam reveals 3+ femoral and dorsalis pedis pulses palpable Neurologic exam normal Skin free of rashes    Assessment:     Doing well post aortic stent graft for abdominal aortic aneurysm. Type II endoleak persists from lumbar branch. No enlargement of the aneurysm at this time.    Plan:     Return in one year with CT angiogram of abdomen and pelvis to follow aortic stent graft and type II endoleak.

## 2012-01-16 ENCOUNTER — Other Ambulatory Visit: Payer: Self-pay | Admitting: *Deleted

## 2012-01-16 DIAGNOSIS — I714 Abdominal aortic aneurysm, without rupture: Secondary | ICD-10-CM

## 2012-01-16 DIAGNOSIS — Z48812 Encounter for surgical aftercare following surgery on the circulatory system: Secondary | ICD-10-CM

## 2012-03-25 ENCOUNTER — Encounter (HOSPITAL_COMMUNITY): Payer: Self-pay | Admitting: Emergency Medicine

## 2012-03-25 ENCOUNTER — Emergency Department (HOSPITAL_COMMUNITY)
Admission: EM | Admit: 2012-03-25 | Discharge: 2012-03-25 | Disposition: A | Discharge status: Home Health | Payer: Medicare Other | Attending: Emergency Medicine | Admitting: Emergency Medicine

## 2012-03-25 DIAGNOSIS — R109 Unspecified abdominal pain: Secondary | ICD-10-CM

## 2012-03-25 DIAGNOSIS — R112 Nausea with vomiting, unspecified: Secondary | ICD-10-CM | POA: Insufficient documentation

## 2012-03-25 DIAGNOSIS — Z950 Presence of cardiac pacemaker: Secondary | ICD-10-CM | POA: Insufficient documentation

## 2012-03-25 DIAGNOSIS — F329 Major depressive disorder, single episode, unspecified: Secondary | ICD-10-CM | POA: Insufficient documentation

## 2012-03-25 DIAGNOSIS — Z8673 Personal history of transient ischemic attack (TIA), and cerebral infarction without residual deficits: Secondary | ICD-10-CM | POA: Insufficient documentation

## 2012-03-25 DIAGNOSIS — K8020 Calculus of gallbladder without cholecystitis without obstruction: Secondary | ICD-10-CM | POA: Insufficient documentation

## 2012-03-25 DIAGNOSIS — F3289 Other specified depressive episodes: Secondary | ICD-10-CM | POA: Insufficient documentation

## 2012-03-25 DIAGNOSIS — Z859 Personal history of malignant neoplasm, unspecified: Secondary | ICD-10-CM | POA: Insufficient documentation

## 2012-03-25 DIAGNOSIS — Z79899 Other long term (current) drug therapy: Secondary | ICD-10-CM | POA: Insufficient documentation

## 2012-03-25 DIAGNOSIS — F411 Generalized anxiety disorder: Secondary | ICD-10-CM | POA: Insufficient documentation

## 2012-03-25 DIAGNOSIS — R509 Fever, unspecified: Secondary | ICD-10-CM | POA: Insufficient documentation

## 2012-03-25 DIAGNOSIS — I10 Essential (primary) hypertension: Secondary | ICD-10-CM | POA: Insufficient documentation

## 2012-03-25 DIAGNOSIS — Z7982 Long term (current) use of aspirin: Secondary | ICD-10-CM | POA: Insufficient documentation

## 2012-03-25 DIAGNOSIS — E785 Hyperlipidemia, unspecified: Secondary | ICD-10-CM | POA: Insufficient documentation

## 2012-03-25 LAB — COMPREHENSIVE METABOLIC PANEL
ALT: 121 U/L — ABNORMAL HIGH (ref 0–53)
Alkaline Phosphatase: 223 U/L — ABNORMAL HIGH (ref 39–117)
BUN: 24 mg/dL — ABNORMAL HIGH (ref 6–23)
CO2: 21 mEq/L (ref 19–32)
Calcium: 8.1 mg/dL — ABNORMAL LOW (ref 8.4–10.5)
GFR calc Af Amer: 43 mL/min — ABNORMAL LOW (ref >90)
GFR calc non Af Amer: 37 mL/min — ABNORMAL LOW (ref >90)
Glucose, Bld: 147 mg/dL — ABNORMAL HIGH (ref 70–99)
Potassium: 3.4 mEq/L — ABNORMAL LOW (ref 3.5–5.1)
Total Protein: 5.9 g/dL — ABNORMAL LOW (ref 6.0–8.3)

## 2012-03-25 LAB — CBC WITH DIFFERENTIAL/PLATELET
Eosinophils Absolute: 0 10*3/uL (ref 0.0–0.7)
Eosinophils Relative: 0 % (ref 0–5)
Hemoglobin: 9.9 g/dL — ABNORMAL LOW (ref 13.0–17.0)
Lymphocytes Relative: 4 % — ABNORMAL LOW (ref 12–46)
Lymphs Abs: 0.3 10*3/uL — ABNORMAL LOW (ref 0.7–4.0)
MCH: 32.2 pg (ref 26.0–34.0)
MCV: 92.2 fL (ref 78.0–100.0)
Monocytes Relative: 4 % (ref 3–12)
RBC: 3.07 MIL/uL — ABNORMAL LOW (ref 4.22–5.81)
WBC: 6.4 10*3/uL (ref 4.0–10.5)

## 2012-03-25 LAB — LIPASE, BLOOD: Lipase: 18 U/L (ref 11–59)

## 2012-03-25 MED ORDER — SODIUM CHLORIDE 0.9 % IV SOLN: INTRAVENOUS | Status: DC

## 2012-03-25 NOTE — ED Provider Notes (Addendum)
History     CSN: [id number]  Arrival date & time 03/25/12  0405   First MD Initiated Contact with Patient 03/25/12 [phone]      No chief complaint on file.   (Consider location/radiation/quality/duration/timing/severity/associated sxs/prior treatment) The history is provided by the patient and the spouse.   The pt is an 32 y male with reported hx of recently diagnosed gall stones in the common bile duct.  These were found at [facility] 2 d ago.   His wife brought him here because this am he had n/v and mild fever.   They were instructed to come to a larger hospital when they were at [facility] because there they do not have ability to do surgery since pt has many comorbidities.  Pt denies cp, abd pain.   He does not have nausea now.  He has not have a cough or diarrhea.  No hx of prior abd surgery. Past Medical History  Diagnosis Date  . Hyperlipidemia   . Hypertension   . Depression   . Anxiety   . Cancer prostate  . Stroke   . Pacemaker 09/2010    Dr. Dani Gobble [place]    Past Surgical History  Procedure Date  . Prostate cancer seed implant     No family history on file.  History  Substance Use Topics  . Smoking status: Former Smoker    Types: Cigarettes    Quit date: 05/05/1993  . Smokeless tobacco: Not on file  . Alcohol Use: Yes     occasional      Review of Systems  Constitutional: Positive for fever.  Respiratory: Negative for cough.   Cardiovascular: Negative for chest pain.  Gastrointestinal: Positive for nausea and vomiting. Negative for abdominal pain and diarrhea.  Skin: Negative for rash.  Neurological: Negative for headaches.  Hematological: Does not bruise/bleed easily.  Psychiatric/Behavioral: Negative for confusion.  All other systems reviewed and are negative.    Allergies  Penicillins  Home Medications   Current Outpatient Rx  Name Route Sig Dispense Refill  . ASPIRIN 81 MG PO TBEC Oral Take 81 mg by mouth daily.      [name] VITAMIN  B-12 IJ Injection Inject as directed every 30 (thirty) days.      . DULOXETINE HCL 60 MG PO CPEP Oral Take 60 mg by mouth daily.      [name] ESOMEPRAZOLE MAGNESIUM 40 MG PO CPDR Oral Take 40 mg by mouth daily before breakfast.      . ROSUVASTATIN CALCIUM 10 MG PO TABS Oral Take 10 mg by mouth daily.        BP 123/40  Pulse 115  Temp 98.2 F (36.8 C) (Oral)  Resp 20  SpO2 95%  Physical Exam  Nursing note and vitals reviewed. Constitutional: He is oriented to person, place, and time. He appears well-developed and well-nourished. No distress.  HENT:  Head: Normocephalic and atraumatic.  Eyes: Conjunctivae are normal.  Neck: Normal range of motion. Neck supple.  Cardiovascular:       tachycardia  Pulmonary/Chest: Effort normal and breath sounds normal. He has no wheezes. He has no rales.  Abdominal: Soft. Bowel sounds are normal. He exhibits no distension. There is no tenderness. There is no rebound and no guarding.  Musculoskeletal: Normal range of motion.  Neurological: He is alert and oriented to person, place, and time.  Skin: Skin is warm and dry.    ED Course  Procedures (including critical care time) 89 y male with  reported hx of gall stones presents after episode of n/v and reported fever. No hx of abd surgery. No hx of dm.  asx now.  abd benign. Will check labs and get records, if available, from [facility].  Then will refer to surgery as needed.  No meds indicated now in this asx pt.   Labs Reviewed  CBC WITH DIFFERENTIAL  COMPREHENSIVE METABOLIC PANEL  LIPASE, BLOOD   No results found.   No diagnosis found.  ECG at 0404. Sinus tachycardia, heart rate 115. Occasional PVC. Right bundle branch block. QS waves inferiorly. Impression sinus tachycardia with occasional PVC, and right bundle branch block  6:45 AM Explained findings and plan for referral to surgeon  MDM  Vomiting resolved Mild hepatitis.  With [place] at outside hospital showing gall stones. No  pancreatitis. No fever. No leukocytosis. No abd ttp or abd pain.         Cheri Guppy, MD 03/25/12 4782  Cheri Guppy, MD 03/25/12 [phone]

## 2012-03-25 NOTE — ED Notes (Signed)
Pt brought to ED by EMS with the complain of feeling unwell.Wife says he has been febrile.He has been having nausea but no diarrhea.

## 2012-03-25 NOTE — Discharge Instructions (Signed)
You have no evidence of infection.  In your gallbladder.  Today.  You do not need surgery .  Today.   Followup with the general surgeon, for reevaluation and to discuss the need for surgery.  Return for uncontrolled pain, fever, or any other concerns

## 2012-03-25 NOTE — ED Notes (Signed)
Pt brought to ED by EMS with complain of feeling unwell and nausea.

## 2012-04-20 ENCOUNTER — Encounter (HOSPITAL_COMMUNITY): Payer: Self-pay | Admitting: *Deleted

## 2012-04-20 ENCOUNTER — Emergency Department (HOSPITAL_COMMUNITY): Payer: Medicare Other

## 2012-04-20 ENCOUNTER — Emergency Department (HOSPITAL_COMMUNITY)
Admission: EM | Admit: 2012-04-20 | Discharge: 2012-04-20 | Disposition: A | Discharge status: Home / Self Care | Payer: Medicare Other | Attending: Emergency Medicine | Admitting: Emergency Medicine

## 2012-04-20 DIAGNOSIS — R945 Abnormal results of liver function studies: Secondary | ICD-10-CM

## 2012-04-20 DIAGNOSIS — Z79899 Other long term (current) drug therapy: Secondary | ICD-10-CM | POA: Insufficient documentation

## 2012-04-20 DIAGNOSIS — Z87891 Personal history of nicotine dependence: Secondary | ICD-10-CM | POA: Insufficient documentation

## 2012-04-20 DIAGNOSIS — E785 Hyperlipidemia, unspecified: Secondary | ICD-10-CM | POA: Insufficient documentation

## 2012-04-20 DIAGNOSIS — Z7982 Long term (current) use of aspirin: Secondary | ICD-10-CM | POA: Insufficient documentation

## 2012-04-20 DIAGNOSIS — K8020 Calculus of gallbladder without cholecystitis without obstruction: Secondary | ICD-10-CM | POA: Insufficient documentation

## 2012-04-20 DIAGNOSIS — Z8673 Personal history of transient ischemic attack (TIA), and cerebral infarction without residual deficits: Secondary | ICD-10-CM | POA: Insufficient documentation

## 2012-04-20 DIAGNOSIS — F341 Dysthymic disorder: Secondary | ICD-10-CM | POA: Insufficient documentation

## 2012-04-20 DIAGNOSIS — R7989 Other specified abnormal findings of blood chemistry: Secondary | ICD-10-CM | POA: Insufficient documentation

## 2012-04-20 DIAGNOSIS — I10 Essential (primary) hypertension: Secondary | ICD-10-CM | POA: Insufficient documentation

## 2012-04-20 LAB — CBC WITH DIFFERENTIAL/PLATELET
Eosinophils Relative: 0 % (ref 0–5)
HCT: 33.4 % — ABNORMAL LOW (ref 39.0–52.0)
Lymphs Abs: 0.4 10*3/uL — ABNORMAL LOW (ref 0.7–4.0)
MCH: 31 pg (ref 26.0–34.0)
MCV: 92.5 fL (ref 78.0–100.0)
Monocytes Absolute: 0.2 10*3/uL (ref 0.1–1.0)
Monocytes Relative: 3 % (ref 3–12)
Neutro Abs: 7.3 10*3/uL (ref 1.7–7.7)
RBC: 3.61 MIL/uL — ABNORMAL LOW (ref 4.22–5.81)
RDW: 15.6 % — ABNORMAL HIGH (ref 11.5–15.5)
WBC: 7.9 10*3/uL (ref 4.0–10.5)

## 2012-04-20 LAB — COMPREHENSIVE METABOLIC PANEL
ALT: 158 U/L — ABNORMAL HIGH (ref 0–53)
CO2: 26 mEq/L (ref 19–32)
Calcium: 9.7 mg/dL (ref 8.4–10.5)
Creatinine, Ser: 1.75 mg/dL — ABNORMAL HIGH (ref 0.50–1.35)
GFR calc Af Amer: 39 mL/min — ABNORMAL LOW (ref >90)
GFR calc non Af Amer: 34 mL/min — ABNORMAL LOW (ref >90)
Glucose, Bld: 159 mg/dL — ABNORMAL HIGH (ref 70–99)
Sodium: 135 mEq/L (ref 135–145)
Total Protein: 7 g/dL (ref 6.0–8.3)

## 2012-04-20 LAB — URINALYSIS, ROUTINE W REFLEX MICROSCOPIC
Bilirubin Urine: NEGATIVE
Hgb urine dipstick: NEGATIVE
Ketones, ur: NEGATIVE mg/dL
Specific Gravity, Urine: 1.015 (ref 1.005–1.030)
pH: 8 (ref 5.0–8.0)

## 2012-04-20 MED ORDER — SODIUM CHLORIDE 0.9 % IV SOLN
INTRAVENOUS | Status: DC
  Administered 2012-04-20: 19:00:00 via INTRAVENOUS

## 2012-04-20 MED ORDER — SODIUM CHLORIDE 0.9 % IV BOLUS (SEPSIS)
500.0000 mL | Freq: Once | INTRAVENOUS | Status: AC
  Administered 2012-04-20: 500 mL via INTRAVENOUS

## 2012-04-20 MED ORDER — ONDANSETRON 8 MG PO TBDP: 8.0000 mg | ORAL_TABLET | Freq: Three times a day (TID) | ORAL | Status: AC | PRN

## 2012-04-20 MED ORDER — CIPROFLOXACIN HCL 500 MG PO TABS: 500.0000 mg | ORAL_TABLET | Freq: Two times a day (BID) | ORAL | Status: AC

## 2012-04-20 NOTE — ED Provider Notes (Signed)
He has been comfortable, without symptoms of any kind in CDU. Abdomen is nontender to palpation. No nausea, vomiting. LFT's elevated, no leukocytosis or fever and [place] that is suggestive of biliary stones. Less suggestive of cholecystitis. Discussed with Dr. [name] of [facility] - given stable clinical picture, patient can be discharged home on Cipro and seen in office on Monday. He is to return to [facility] ED if symptoms worsen over the weekend.  Rodena Medin, PA-C 04/20/12 2311

## 2012-04-20 NOTE — ED Notes (Signed)
Family at bedside. 

## 2012-04-20 NOTE — ED Notes (Signed)
DR. Radford Pax AT BEDSIDE UPDATING ON PT. AND FAMILY ON TEST RESULTS AND PLAN OF CARE , PT. WAIITNG FOR ULTRASOUND , DR. Radford Pax AWARE ON PT'S. TACHYCARDIA.

## 2012-04-20 NOTE — ED Notes (Signed)
Pt in via EMS- per EMS pt in c/o originally of gallstones, history of same, while en route pt became restless, placed on monitor, abnormal EKG noted, pt denies pain, given 4mg  zofran, IV 20g in RFA

## 2012-04-20 NOTE — ED Provider Notes (Signed)
History     CSN: [id number]  Arrival date & time 04/20/12  1742   First MD Initiated Contact with Patient 04/20/12 1804      No chief complaint on file.    HPI Patient has a history of Gallstone and was seen at [facility]. Instructed to seek medical attention if condition worsened. Wife reports that patient was having 2-3 hours of chills, nausea and vomiting begining today. Denies fever, diarrhea. Denies pain  Past Medical History  Diagnosis Date  . Hyperlipidemia   . Hypertension   . Depression   . Anxiety   . Cancer prostate  . Stroke   . Pacemaker 09/2010    Dr. Dani [place]    Past Surgical History  Procedure Date  . Prostate cancer seed implant     History reviewed. No pertinent family history.  History  Substance Use Topics  . Smoking status: Former Smoker    Types: Cigarettes    Quit date: 05/05/1993  . Smokeless tobacco: Not on file  . Alcohol Use: Yes     occasional      Review of Systems  All other systems reviewed and are negative.    Allergies  Penicillins  Home Medications   Current Outpatient Rx  Name Route Sig Dispense Refill  . ASPIRIN 81 MG PO TBEC Oral Take 81 mg by mouth daily.      [name] VITAMIN D-3 PO Oral Take 1,000 Units by mouth daily.    [name] VITAMIN B-12 IJ Injection Inject as directed every 30 (thirty) days.      . DULOXETINE HCL 60 MG PO CPEP Oral Take 60 mg by mouth daily.      [name] ESOMEPRAZOLE MAGNESIUM 40 MG PO CPDR Oral Take 40 mg by mouth daily before breakfast.    . MAGNESIUM 400 MG PO CAPS Oral Take 1 capsule by mouth 3 (three) times daily.    [name] POTASSIUM CHLORIDE CRYS ER 20 MEQ PO TBCR Oral Take 20 mEq by mouth daily.    [name] ALIGN PO Oral Take 1 tablet by mouth daily.    [name] RAMIPRIL 5 MG PO CAPS Oral Take 5 mg by mouth daily.    [name] CIPROFLOXACIN HCL 500 MG PO TABS Oral Take 1 tablet (500 mg total) by mouth 2 (two) times daily. 28 tablet 0  . ONDANSETRON 8 MG PO TBDP Oral Take 1 tablet (8 mg total) by mouth every 8  (eight) hours as needed for nausea. 20 tablet 0    BP 112/69  Pulse 108  Temp 98 F (36.7 C) (Oral)  Resp 27  SpO2 93%  Physical Exam  Nursing note and vitals reviewed. Constitutional: He is oriented to person, place, and time. He appears well-developed. No distress.  HENT:  Head: Normocephalic and atraumatic.  Eyes: Pupils are equal, round, and reactive to light.  Neck: Normal range of motion.  Cardiovascular: Normal rate and intact distal pulses.   Pulmonary/Chest: No respiratory distress.  Abdominal: Normal appearance. He exhibits no distension. There is no tenderness. There is no rebound.  Musculoskeletal: Normal range of motion.  Neurological: He is alert and oriented to person, place, and time. No cranial nerve deficit.  Skin: Skin is warm and dry. No rash noted.  Psychiatric: He has a normal mood and affect. His behavior is normal.    ED Course  Procedures (including critical care time)  Labs Reviewed  COMPREHENSIVE METABOLIC PANEL - Abnormal; Notable for the following:    Glucose, Bld 159 (*)  BUN 24 (*)     Creatinine, Ser 1.75 (*)     Albumin 3.3 (*)     AST 410 (*)     ALT 158 (*)     Alkaline Phosphatase 335 (*)     Total Bilirubin 2.0 (*)     GFR calc non Af Amer 34 (*)     GFR calc Af Amer 39 (*)     All other components within normal limits  CBC WITH DIFFERENTIAL - Abnormal; Notable for the following:    RBC 3.61 (*)     Hemoglobin 11.2 (*)     HCT 33.4 (*)     RDW 15.6 (*)     Platelets 133 (*)     Neutrophils Relative 92 (*)     Lymphocytes Relative 5 (*)     Lymphs Abs 0.4 (*)     All other components within normal limits  URINALYSIS, ROUTINE W REFLEX MICROSCOPIC - Abnormal; Notable for the following:    APPearance CLOUDY (*)     All other components within normal limits  LIPASE, BLOOD  LACTIC ACID, PLASMA  LAB REPORT - SCANNED   No results found. US Abdomen Limited RUQ (Final result)   Result time:04/20/12 2224    Final result by Rad  Results In Interface (04/20/12 22:24:19)    Narrative:   *RADIOLOGY REPORT*  Clinical Data: Rule out cholecystitis  LIMITED ABDOMINAL ULTRASOUND - RIGHT UPPER QUADRANT  Comparison: None.  Findings:  Gallbladder: There is a 5 mm stone at the neck of the gallbladder. Wall thickness is upper normal at 3 mm. Negative Murphy's sign. No pericholecystic fluid.  Common bile duct: The common bile duct is dilated with caliber of 9 mm.  Liver: There is limited visualization of the liver secondary to overlying bowel gas. No obvious focal mass.  IMPRESSION: 5 mm stone in the neck of the gallbladder compatible with cholelithiasis.  Common bile duct is dilated. Correlate with liver function tests as for the need for MRCP. Biliary duct stones cannot be excluded.  Limited visualization of the liver.      1. Cholelithiasis   2. Abnormal liver function tests       MDM          Nelia Shi, MD 04/22/12 2309

## 2012-04-20 NOTE — ED Notes (Signed)
Patient has a history of Gallstone and was seen at [facility].  Instructed to seek medical attention if condition worsened.  Wife reports that patient was having  Chills, nausea and vomiting begining today.  Denies fever, diarrhea. Denies pain

## 2012-04-23 ENCOUNTER — Telehealth: Payer: Self-pay | Admitting: Gastroenterology

## 2012-04-23 ENCOUNTER — Encounter: Payer: Self-pay | Admitting: Nurse Practitioner

## 2012-04-23 ENCOUNTER — Ambulatory Visit (INDEPENDENT_AMBULATORY_CARE_PROVIDER_SITE_OTHER): Payer: Medicare Other | Admitting: Nurse Practitioner

## 2012-04-23 ENCOUNTER — Other Ambulatory Visit (INDEPENDENT_AMBULATORY_CARE_PROVIDER_SITE_OTHER): Payer: Medicare Other

## 2012-04-23 VITALS — BP 130/64 | HR 68 | Ht 72.0 in | Wt 170.4 lb

## 2012-04-23 DIAGNOSIS — K8020 Calculus of gallbladder without cholecystitis without obstruction: Secondary | ICD-10-CM | POA: Insufficient documentation

## 2012-04-23 DIAGNOSIS — R945 Abnormal results of liver function studies: Secondary | ICD-10-CM

## 2012-04-23 DIAGNOSIS — R17 Unspecified jaundice: Secondary | ICD-10-CM

## 2012-04-23 DIAGNOSIS — R7989 Other specified abnormal findings of blood chemistry: Secondary | ICD-10-CM

## 2012-04-23 DIAGNOSIS — R6883 Chills (without fever): Secondary | ICD-10-CM

## 2012-04-23 LAB — PROTIME-INR
INR: 0.9 ratio (ref 0.8–1.0)
Prothrombin Time: 9.7 s — ABNORMAL LOW (ref 10.2–12.4)

## 2012-04-23 NOTE — Telephone Encounter (Signed)
Pt scheduled to see Willette Cluster NP today at 3pm. Pt aware of appt date and time.

## 2012-04-23 NOTE — Progress Notes (Signed)
04/23/2012 Paul Fisher [id number] 03/14/1924   HISTORY OF PRESENT ILLNESS: Patient is an 78-year-old male here for evaluation of common bile duct stones. In June patient had some transient upper abdominal pain and was evaluated in [place] where he lives. Patient was told he had gallstones and in the event of recurrent pain would need to be evaluated at a larger hospital as a [facility] did not have the ability to do surgery in setting of multiple comorbidities. Patient really did not have recurrent abdominal pain but over the weekend developed nausea and chills. Patient presented to call the hospital emergency department where his liver functions were abnormal and an ultrasound showed a gallstone in the neck of the gallbladder as well as a dilated common bile duct of 9mm.  The emergency department spoke with Dr. Kaplan, at our on-call physician for the weekend and, an appointment was made for the to be seen in the office today. Dr. Kaplan did recommend the emergency department start Cipro which they did .  ED labs remarkable for AST 410, ALT 158, alkaline phosphatase 335, total bilirubin 2 .0.  Platelet count 133. She comes in with his family who helps provide the history as the patient is hard of hearing. Patient feels okay today.   Past Medical History  Diagnosis Date  . Hyperlipidemia   . Hypertension   . Depression   . Anxiety   . Cancer prostate  . Stroke   . Pacemaker 09/2010    Dr. Gehi [place]  . Abdominal aortic aneurysm    Past Surgical History  Procedure Date  . Prostate cancer seed implant   . Arotic stent     Dr. Lawson  . Pacemaker insertion 01/12/2011    [employer] mod# [id number], serial # [id number]    reports that he quit smoking about 18 years ago. His smoking use included Cigarettes. He has never used smokeless tobacco. He reports that he drinks alcohol. He reports that he does not use illicit drugs. family history includes Diabetes in his father and Kidney  failure in his mother.  There is no history of Colon cancer. Allergies  Allergen Reactions  . Penicillins Swelling    "knot on head" per wife      Outpatient Encounter Prescriptions as of 04/23/2012  Medication Sig Dispense Refill  . aspirin 81 MG EC tablet Take 81 mg by mouth daily.        . Cholecalciferol (VITAMIN D-3 PO) Take 1,000 Units by mouth daily.      . ciprofloxacin (CIPRO) 500 MG tablet Take 1 tablet (500 mg total) by mouth 2 (two) times daily.  28 tablet  0  . Cyanocobalamin (VITAMIN B-12 IJ) Inject as directed every 30 (thirty) days.        . DULoxetine (CYMBALTA) 60 MG capsule Take 60 mg by mouth daily.        . esomeprazole (NEXIUM) 40 MG capsule Take 40 mg by mouth daily before breakfast.      . Magnesium 400 MG CAPS Take 1 capsule by mouth 3 (three) times daily.      . ondansetron (ZOFRAN ODT) 8 MG disintegrating tablet Take 1 tablet (8 mg total) by mouth every 8 (eight) hours as needed for nausea.  20 tablet  0  . potassium chloride SA (K-DUR,KLOR-CON) 20 MEQ tablet Take 20 mEq by mouth daily.      . Probiotic Product (ALIGN PO) Take 1 tablet by mouth daily.      .   ramipril (ALTACE) 5 MG capsule Take 5 mg by mouth daily.         REVIEW OF SYSTEMS  : All other systems reviewed and negative except where noted in the History of Present Illness.   PHYSICAL EXAM: BP 130/64  Pulse 68  Ht 6' (1.829 m)  Wt 170 lb 6.4 oz (77.293 kg)  BMI 23.11 kg/m2 General: Well developed white male in no acute distress Head: Normocephalic and atraumatic Eyes:  sclerae anicteric,conjunctive pink. Ears: Hard of hearing  Mouth: No deformity or lesions Neck: Supple, no masses.  Lungs: Fine basilar inspiratory crackles Heart: Regular rate and rhythm; no murmurs heard Abdomen: Soft, non distended, nontender. No masses or hepatomegaly noted. Normal Bowel sounds Rectal: not done Musculoskeletal: Symmetrical with no gross deformities  Skin: No lesions on visible  extremities Extremities: No edema or deformities noted Neurological: Alert oriented x 4, grossly nonfocal Cervical Nodes:  No significant cervical adenopathy Psychological:  Alert and cooperative. Normal mood and affect  ASSESSMENT AND PLAN: 1. 78-year-old male with nausea, chills and upper abdominal pain in the setting of abnormal liver function studies and ultrasound revealing gallstones and dilated common bile duct. Patient is asymptomatic now, suspect he passed a gallstone. Similar symptoms in June, he may have passed a stone then as well. Patient needs an ERCP with stone extraction and possible sphincterotomy. We discussed the procedure in detail including risk of pancreatitis, bleeding, breathing and cardiac  problems. We discussed not doing anything which puts the patient at risk for cholangitis, biliary pancreatitis, etc. Patient agrees to proceed with ERCP. Dr. Kaplan will perform the procedure. He spoke with surgery, outpatient surgical evaluation for laparoscopic cholecystectomy will be arranged. Patient needs a pre-procedure protime which I will have drawn today  2. multiple medical problems as listed in past medical history.  3. permanent  pacemaker.  

## 2012-04-23 NOTE — Patient Instructions (Addendum)
Please go to the basement level to have your labs drawn.  We will call you tomorrow with the appointment date and time for the ERCP procedure with [name]. This should be Wed afternoon 7-31.

## 2012-04-24 ENCOUNTER — Encounter (HOSPITAL_COMMUNITY): Payer: Self-pay | Admitting: Pharmacy Technician

## 2012-04-24 ENCOUNTER — Other Ambulatory Visit: Payer: Self-pay | Admitting: *Deleted

## 2012-04-24 ENCOUNTER — Telehealth: Payer: Self-pay | Admitting: *Deleted

## 2012-04-24 DIAGNOSIS — K8020 Calculus of gallbladder without cholecystitis without obstruction: Secondary | ICD-10-CM

## 2012-04-24 NOTE — Telephone Encounter (Signed)
Called and spoke to the patient's wife Paul Fisher.  Advised her to go to the [facility] front entrance to admitting and go to the Enodscopy Unit.  Advised her to have her husband go to admitting.  They should arrive at 2 :00 PM.  I went over the instructions and she know he should have no food after midnight and clear liquids, no red or purple  Until 11:30  AM.  Will call the daughter Paul Fisher as well at [phone].

## 2012-04-24 NOTE — Progress Notes (Signed)
Reviewed and agree with management. Demetric D. Allah Reason, M.D., FACG  

## 2012-04-24 NOTE — Progress Notes (Signed)
Reviewed and agree with management. Lanorris D. Maanvi Lecompte, M.D., FACG  

## 2012-04-25 ENCOUNTER — Ambulatory Visit (HOSPITAL_COMMUNITY)
Admission: RE | Admit: 2012-04-25 | Discharge: 2012-04-25 | Disposition: A | Discharge status: Home / Self Care | Payer: Medicare Other | Source: Ambulatory Visit | Attending: Gastroenterology | Admitting: Gastroenterology

## 2012-04-25 ENCOUNTER — Encounter (HOSPITAL_COMMUNITY): Payer: Self-pay | Admitting: *Deleted

## 2012-04-25 ENCOUNTER — Ambulatory Visit (HOSPITAL_COMMUNITY): Payer: Medicare Other | Admitting: Anesthesiology

## 2012-04-25 ENCOUNTER — Encounter (HOSPITAL_COMMUNITY)
Admission: RE | Disposition: A | Discharge status: Home / Self Care | Payer: Self-pay | Source: Ambulatory Visit | Attending: Gastroenterology

## 2012-04-25 ENCOUNTER — Encounter (HOSPITAL_COMMUNITY): Payer: Self-pay | Admitting: Anesthesiology

## 2012-04-25 ENCOUNTER — Ambulatory Visit (HOSPITAL_COMMUNITY): Payer: Medicare Other

## 2012-04-25 DIAGNOSIS — E785 Hyperlipidemia, unspecified: Secondary | ICD-10-CM | POA: Insufficient documentation

## 2012-04-25 DIAGNOSIS — I714 Abdominal aortic aneurysm, without rupture, unspecified: Secondary | ICD-10-CM | POA: Insufficient documentation

## 2012-04-25 DIAGNOSIS — Z79899 Other long term (current) drug therapy: Secondary | ICD-10-CM | POA: Insufficient documentation

## 2012-04-25 DIAGNOSIS — I10 Essential (primary) hypertension: Secondary | ICD-10-CM | POA: Insufficient documentation

## 2012-04-25 DIAGNOSIS — Z8673 Personal history of transient ischemic attack (TIA), and cerebral infarction without residual deficits: Secondary | ICD-10-CM | POA: Insufficient documentation

## 2012-04-25 DIAGNOSIS — Z950 Presence of cardiac pacemaker: Secondary | ICD-10-CM | POA: Insufficient documentation

## 2012-04-25 DIAGNOSIS — K8050 Calculus of bile duct without cholangitis or cholecystitis without obstruction: Secondary | ICD-10-CM | POA: Insufficient documentation

## 2012-04-25 DIAGNOSIS — Z7982 Long term (current) use of aspirin: Secondary | ICD-10-CM | POA: Insufficient documentation

## 2012-04-25 HISTORY — DX: Gastro-esophageal reflux disease without esophagitis: K21.9

## 2012-04-25 HISTORY — PX: ERCP: SHX5425

## 2012-04-25 SURGERY — ERCP, WITH INTERVENTION IF INDICATED
Anesthesia: Monitor Anesthesia Care

## 2012-04-25 SURGERY — ERCP, WITH INTERVENTION IF INDICATED
Anesthesia: General

## 2012-04-25 MED ORDER — PHENYLEPHRINE HCL 10 MG/ML IJ SOLN
INTRAMUSCULAR | Status: DC | PRN
  Administered 2012-04-25 (×2): 30 ug via INTRAVENOUS

## 2012-04-25 MED ORDER — FENTANYL CITRATE 0.05 MG/ML IJ SOLN: 25.0000 ug | INTRAMUSCULAR | Status: DC | PRN

## 2012-04-25 MED ORDER — ONDANSETRON HCL 4 MG/2ML IJ SOLN
INTRAMUSCULAR | Status: DC | PRN
  Administered 2012-04-25: 2 mg via INTRAVENOUS

## 2012-04-25 MED ORDER — SUCCINYLCHOLINE CHLORIDE 20 MG/ML IJ SOLN
INTRAMUSCULAR | Status: DC | PRN
  Administered 2012-04-25: 120 mg via INTRAVENOUS
  Administered 2012-04-25: 30 mg via INTRAVENOUS

## 2012-04-25 MED ORDER — PROMETHAZINE HCL 25 MG/ML IJ SOLN: 6.2500 mg | INTRAMUSCULAR | Status: DC | PRN

## 2012-04-25 MED ORDER — LIDOCAINE HCL (CARDIAC) 20 MG/ML IV SOLN
INTRAVENOUS | Status: DC | PRN
  Administered 2012-04-25: 30 mg via INTRAVENOUS

## 2012-04-25 MED ORDER — SODIUM CHLORIDE 0.9 % IV SOLN
INTRAVENOUS | Status: DC | PRN
  Administered 2012-04-25: 16:00:00 via INTRAVENOUS

## 2012-04-25 MED ORDER — CIPROFLOXACIN IN D5W 400 MG/200ML IV SOLN
INTRAVENOUS | Status: AC
  Filled 2012-04-25: qty 200

## 2012-04-25 MED ORDER — CIPROFLOXACIN IN D5W 400 MG/200ML IV SOLN
400.0000 mg | Freq: Two times a day (BID) | INTRAVENOUS | Status: DC
  Administered 2012-04-25: 400 mg via INTRAVENOUS

## 2012-04-25 MED ORDER — SODIUM CHLORIDE 0.9 % IV SOLN
INTRAVENOUS | Status: DC | PRN
  Administered 2012-04-25: 17:00:00

## 2012-04-25 MED ORDER — FENTANYL CITRATE 0.05 MG/ML IJ SOLN
INTRAMUSCULAR | Status: DC | PRN
  Administered 2012-04-25 (×2): 50 ug via INTRAVENOUS

## 2012-04-25 MED ORDER — PROPOFOL 10 MG/ML IV EMUL
INTRAVENOUS | Status: DC | PRN
  Administered 2012-04-25: 150 mg via INTRAVENOUS

## 2012-04-25 MED ORDER — LACTATED RINGERS IV SOLN: INTRAVENOUS | Status: DC

## 2012-04-25 MED ORDER — EPHEDRINE SULFATE 50 MG/ML IJ SOLN
INTRAMUSCULAR | Status: DC | PRN
  Administered 2012-04-25: 10 mg via INTRAVENOUS
  Administered 2012-04-25: 15 mg via INTRAVENOUS

## 2012-04-25 MED ORDER — SODIUM CHLORIDE 0.9 % IV SOLN
10000.0000 ug | INTRAVENOUS | Status: DC | PRN
  Administered 2012-04-25: 50 ug/min via INTRAVENOUS

## 2012-04-25 NOTE — Transfer of Care (Signed)
Immediate Anesthesia Transfer of Care Note  Patient: Paul Fisher  Procedure(s) Performed: Procedure(s) (LRB): ENDOSCOPIC RETROGRADE CHOLANGIOPANCREATOGRAPHY (ERCP) ([name])  Patient Location: PACU  Anesthesia Type: General  Level of Consciousness: awake  Airway & Oxygen Therapy: Patient Spontanous Breathing and Patient connected to face mask oxygen  Post-op Assessment: Report given to PACU RN and Post -op Vital signs reviewed and stable  Post vital signs: Reviewed and stable  Complications: No apparent anesthesia complications

## 2012-04-25 NOTE — Interval H&P Note (Signed)
History and Physical Interval Note:  04/25/2012 3:43 PM  Paul Fisher  has presented today for surgery, with the diagnosis of Stones  The various methods of treatment have been discussed with the patient and family. After consideration of risks, benefits and other options for treatment, the patient has consented to  Procedure(s) (LRB): ENDOSCOPIC RETROGRADE CHOLANGIOPANCREATOGRAPHY (ERCP) ([name]) as a surgical intervention .  The patient's history has been reviewed, patient examined, no change in status, stable for surgery.  I have reviewed the patient's chart and labs.  Questions were answered to the patient's satisfaction.     The recent H&P (dated *04/23/12**) was reviewed, the patient was examined and there is no change in the patients condition since that H&P was completed.   Ramar Nobrega  04/25/2012, 3:43 PM   Melvia Heaps

## 2012-04-25 NOTE — Anesthesia Postprocedure Evaluation (Signed)
Anesthesia Post Note  Patient: Paul Fisher  Procedure(s) Performed: Procedure(s) (LRB): ENDOSCOPIC RETROGRADE CHOLANGIOPANCREATOGRAPHY (ERCP) ([name])  Anesthesia type: General  Patient location: PACU  Post pain: Pain level controlled  Post assessment: Post-op Vital signs reviewed  Last Vitals:  Filed Vitals:   04/25/12 1730  BP: 123/59  Pulse: 91  Temp: 36.7 C  Resp: 17    Post vital signs: Reviewed  Level of consciousness: sedated  Complications: No apparent anesthesia complications

## 2012-04-25 NOTE — Anesthesia Preprocedure Evaluation (Addendum)
Anesthesia Evaluation  Patient identified by MRN, date of birth, ID band Patient awake    Reviewed: Allergy & Precautions, H&P , NPO status , Patient's Chart, lab work & pertinent test results  Airway Mallampati: II TM Distance: >3 FB Neck ROM: Full    Dental  (+) Teeth Intact, Caps and Dental Advisory Given   Pulmonary neg pulmonary ROS,  breath sounds clear to auscultation  Pulmonary exam normal       Cardiovascular hypertension, Pt. on medications + CAD and + Peripheral Vascular Disease (AAA with aortic stent) + pacemaker Rhythm:Regular Rate:Normal     Neuro/Psych Anxiety Depression CVA    GI/Hepatic negative GI ROS, Neg liver ROS, GERD-  Medicated,  Endo/Other  negative endocrine ROS  Renal/GU Renal InsufficiencyRenal disease   History of prostate CA negative genitourinary   Musculoskeletal negative musculoskeletal ROS (+)   Abdominal   Peds  Hematology negative hematology ROS (+)   Anesthesia Other Findings   Reproductive/Obstetrics negative OB ROS                          Anesthesia Physical Anesthesia Plan  ASA: III  Anesthesia Plan: General   Post-op Pain Management:    Induction: Intravenous  Airway Management Planned: Oral ETT  Additional Equipment:   Intra-op Plan:   Post-operative Plan: Extubation in OR  Informed Consent: I have reviewed the patients History and Physical, chart, labs and discussed the procedure including the risks, benefits and alternatives for the proposed anesthesia with the patient or authorized representative who has indicated his/her understanding and acceptance.   Dental advisory given  Plan Discussed with: CRNA  Anesthesia Plan Comments:         Anesthesia Quick Evaluation

## 2012-04-25 NOTE — Op Note (Signed)
[facility] [address] [place], [place]  [address]  ERCP PROCEDURE REPORT  PATIENT:  Paul Fisher, Paul Fisher  MR#:  [id number] BIRTHDATE:  09-21-1924  GENDER:  male  ENDOSCOPIST:  Barbette Hair. [name], MD ASSISTANT:  Kandice Robinsons, Jimmey Ralph, RN, CGRN, Janae Sauce, RN  PROCEDURE DATE:  04/25/2012 PROCEDURE:  ERCP with removal of stones  INDICATIONS:  suspected stone  MEDICATIONS:   MAC sedation, administered by CRNA, cipro 400 mg IV TOPICAL ANESTHETIC:  DESCRIPTION OF PROCEDURE:   After the risks benefits and alternatives of the procedure were thoroughly explained, informed consent was obtained.  The Pentax ERCP [id number] endoscope was introduced through the mouth and advanced to the third portion of the duodenum.  A diverticulum was found. periampullary diverticulum  The pancreatic duct was filled to the tail and appeared to be normal. Care was taken not to overfill the ductal system.  Multiple stones were found in the common bile duct. Selective cannulation of the CBD demonstrated to stones measuring 12-65mm each. A 15mm sphincterotomy cut was made and stones were removed with a 15mm balloon stone extractor. Multiple passes were made down the CBD through the sphincterotomized papilla (see image2, image3, and image5).    The scope was then completely withdrawn from the patient and the procedure terminated. <<PROCEDUREIMAGES>>  COMPLICATIONS:  None  ENDOSCOPIC IMPRESSION: 1) Stones, multiple in the common bile duct - s/p sphincterotomy and stone extraction 2) Periampullary Diverticulum 3) Normal pancreatic duct RECOMMENDATIONS: 1) cholecystectomy  ______________________________ Barbette Hair. [name], MD  cc: Dr. Claud Kelp  CC:  n. eSIGNED:   Barbette Hair. Kaplan at 04/25/2012 05:01 PM  [place]esto Charon, [phone]

## 2012-04-25 NOTE — H&P (View-Only) (Signed)
04/23/2012 Paul Fisher [id number] 29-Sep-1923   HISTORY OF PRESENT ILLNESS: Patient is an 78 year old male here for evaluation of common bile duct stones. In June patient had some transient upper abdominal pain and was evaluated in [facility] where he lives. Patient was told he had gallstones and in the event of recurrent pain would need to be evaluated at a larger hospital as a [facility] did not have the ability to do surgery in setting of multiple comorbidities. Patient really did not have recurrent abdominal pain but over the weekend developed nausea and chills. Patient presented to call the hospital emergency department where his liver functions were abnormal and an ultrasound showed a gallstone in the neck of the gallbladder as well as a dilated common bile duct of 9mm.  The emergency department spoke with Dr. [name], at our on-call physician for the weekend and, an appointment was made for the to be seen in the office today. Dr. [name] did recommend the emergency department start Cipro which they did .  ED labs remarkable for AST 410, ALT 158, alkaline phosphatase 335, total bilirubin 2 .0.  Platelet count 133. She comes in with his family who helps provide the history as the patient is hard of hearing. Patient feels okay today.   Past Medical History  Diagnosis Date  . Hyperlipidemia   . Hypertension   . Depression   . Anxiety   . Cancer prostate  . Stroke   . Pacemaker 09/2010    [name] [place]  . Abdominal aortic aneurysm    Past Surgical History  Procedure Date  . Prostate cancer seed implant   . Arotic stent     Dr. Hart [place]  . Pacemaker insertion 01/12/2011    [employer] mod# [phone], serial # [id number]    reports that he quit smoking about 18 years ago. His smoking use included Cigarettes. He has never used smokeless tobacco. He reports that he drinks alcohol. He reports that he does not use illicit drugs. family history includes Diabetes in his father and Kidney  failure in his mother.  There is no history of Colon cancer. Allergies  Allergen Reactions  . Penicillins Swelling    "knot on head" per wife      Outpatient Encounter Prescriptions as of 04/23/2012  Medication Sig Dispense Refill  . aspirin 81 MG EC tablet Take 81 mg by mouth daily.        . Cholecalciferol (VITAMIN D-3 PO) Take 1,000 Units by mouth daily.      . ciprofloxacin (CIPRO) 500 MG tablet Take 1 tablet (500 mg total) by mouth 2 (two) times daily.  28 tablet  0  . Cyanocobalamin (VITAMIN B-12 IJ) Inject as directed every 30 (thirty) days.        . DULoxetine (CYMBALTA) 60 MG capsule Take 60 mg by mouth daily.        [name] esomeprazole (NEXIUM) 40 MG capsule Take 40 mg by mouth daily before breakfast.      . Magnesium 400 MG CAPS Take 1 capsule by mouth 3 (three) times daily.      . ondansetron (ZOFRAN ODT) 8 MG disintegrating tablet Take 1 tablet (8 mg total) by mouth every 8 (eight) hours as needed for nausea.  20 tablet  0  . potassium chloride SA (K-DUR,KLOR-CON) 20 MEQ tablet Take 20 mEq by mouth daily.      . Probiotic Product (ALIGN PO) Take 1 tablet by mouth daily.      [name]  ramipril (ALTACE) 5 MG capsule Take 5 mg by mouth daily.         REVIEW OF SYSTEMS  : All other systems reviewed and negative except where noted in the History of Present Illness.   PHYSICAL EXAM: BP 130/64  Pulse 68  Ht 6' (1.829 m)  Wt 170 lb 6.4 oz (77.293 kg)  BMI 23.11 kg/m2 General: Well developed white male in no acute distress Head: Normocephalic and atraumatic Eyes:  sclerae anicteric,conjunctive pink. Ears: Hard of hearing  Mouth: No deformity or lesions Neck: Supple, no masses.  Lungs: Fine basilar inspiratory crackles Heart: Regular rate and rhythm; no murmurs heard Abdomen: Soft, non distended, nontender. No masses or hepatomegaly noted. Normal Bowel sounds Rectal: not done Musculoskeletal: Symmetrical with no gross deformities  Skin: No lesions on visible  extremities Extremities: No edema or deformities noted Neurological: Alert oriented x 4, grossly nonfocal Cervical Nodes:  No significant cervical adenopathy Psychological:  Alert and cooperative. Normal mood and affect  ASSESSMENT AND PLAN: 38. 78 year old male with nausea, chills and upper abdominal pain in the setting of abnormal liver function studies and ultrasound revealing gallstones and dilated common bile duct. Patient is asymptomatic now, suspect he passed a gallstone. Similar symptoms in June, he may have passed a stone then as well. Patient needs an ERCP with stone extraction and possible sphincterotomy. We discussed the procedure in detail including risk of pancreatitis, bleeding, breathing and cardiac  problems. We discussed not doing anything which puts the patient at risk for cholangitis, biliary pancreatitis, etc. Patient agrees to proceed with ERCP. Dr. [name] will perform the procedure. He spoke with surgery, outpatient surgical evaluation for laparoscopic cholecystectomy will be arranged. Patient needs a pre-procedure protime which I will have drawn today  2. multiple medical problems as listed in past medical history.  3. permanent  pacemaker.

## 2012-04-26 ENCOUNTER — Telehealth: Payer: Self-pay

## 2012-04-26 NOTE — Telephone Encounter (Signed)
Pt scheduled to see Dr. Derrell Lolling 05/22/12 arrival time 9:15am for a 9:45am appt. Pts wife states he has another appt that day and wanted to move the appt. Pts wife given the phone number for [facility] to reschedule the appt.

## 2012-05-01 ENCOUNTER — Encounter (HOSPITAL_COMMUNITY): Payer: Self-pay | Admitting: Gastroenterology

## 2012-05-21 ENCOUNTER — Encounter: Payer: Self-pay | Admitting: Vascular Surgery

## 2012-05-22 ENCOUNTER — Encounter: Payer: Self-pay | Admitting: Vascular Surgery

## 2012-05-22 ENCOUNTER — Ambulatory Visit (INDEPENDENT_AMBULATORY_CARE_PROVIDER_SITE_OTHER): Payer: Medicare Other | Admitting: Vascular Surgery

## 2012-05-22 ENCOUNTER — Ambulatory Visit (INDEPENDENT_AMBULATORY_CARE_PROVIDER_SITE_OTHER): Payer: Medicare Other | Admitting: General Surgery

## 2012-05-22 ENCOUNTER — Ambulatory Visit
Admission: RE | Admit: 2012-05-22 | Discharge: 2012-05-22 | Disposition: A | Discharge status: Home / Self Care | Payer: Medicare Other | Source: Ambulatory Visit | Attending: Vascular Surgery | Admitting: Vascular Surgery

## 2012-05-22 VITALS — BP 145/85 | HR 105 | Resp 20 | Ht 72.0 in | Wt 172.0 lb

## 2012-05-22 DIAGNOSIS — I714 Abdominal aortic aneurysm, without rupture: Secondary | ICD-10-CM

## 2012-05-22 DIAGNOSIS — Z48812 Encounter for surgical aftercare following surgery on the circulatory system: Secondary | ICD-10-CM

## 2012-05-22 MED ORDER — IOHEXOL 350 MG/ML SOLN
70.0000 mL | Freq: Once | INTRAVENOUS | Status: AC | PRN
  Administered 2012-05-22: 70 mL via INTRAVENOUS

## 2012-05-22 NOTE — Addendum Note (Signed)
Addended by: Sharee Pimple on: 05/22/2012 01:50 PM   Modules accepted: Orders

## 2012-05-22 NOTE — Progress Notes (Signed)
Subjective:     Patient ID: Paul Fisher, male   DOB: [birth year], 78 y.o.   MRN: [id number]  HPI this 78 year old male is 3 years status post insertion aortobilateral, and iliac stent graft-Gore for AAA. He has previously had a coiling and glue procedure done by Dr.Yamagata for a slight enlargement of the sac and a type II endoleak and a patent IMA. Today the patient had a CT angiogram which I have reviewed. Patient denies any abdominal or back pains. He continues to have dementia and has become weaker according to his wife. He does have a good appetite and walks with a walker with instability.   Past Medical History  Diagnosis Date  . Hyperlipidemia   . Hypertension   . Depression   . Anxiety   . Cancer prostate  . Stroke   . Pacemaker 09/2010    [name] [place]  . Abdominal aortic aneurysm   . Coronary artery disease   . GERD (gastroesophageal reflux disease)     History  Substance Use Topics  . Smoking status: Former Smoker    Types: Cigarettes    Quit date: 05/05/1993  . Smokeless tobacco: Never Used  . Alcohol Use: Yes     occasional    Family History  Problem Relation Age of Onset  . Colon cancer Neg Hx   . Diabetes Father   . Kidney failure Mother     born with only one    Allergies  Allergen Reactions  . Penicillins Swelling    "knot on head" per wife    Current outpatient prescriptions:aspirin 81 MG EC tablet, Take 81 mg by mouth daily.  , Disp: , Rfl: ;  Cholecalciferol (VITAMIN D-3 PO), Take 1,000 Units by mouth daily., Disp: , Rfl: ;  Cyanocobalamin (VITAMIN B-12 IJ), Inject as directed every 30 (thirty) days.  , Disp: , Rfl: ;  DULoxetine (CYMBALTA) 60 MG capsule, Take 60 mg by mouth daily.  , Disp: , Rfl: ;  Magnesium 400 MG CAPS, Take 1 capsule by mouth 3 (three) times daily., Disp: , Rfl:  Nutritional Supplements (EQUATE PO), Take 20 mg by mouth daily., Disp: , Rfl: ;  potassium chloride SA (K-DUR,KLOR-CON) 20 MEQ tablet, Take 20 mEq by mouth daily.,  Disp: , Rfl: ;  Probiotic Product (ALIGN PO), Take 1 tablet by mouth daily., Disp: , Rfl: ;  ramipril (ALTACE) 5 MG capsule, Take 5 mg by mouth daily., Disp: , Rfl: ;  esomeprazole (NEXIUM) 40 MG capsule, Take 40 mg by mouth daily before breakfast., Disp: , Rfl:  No current facility-administered medications for this visit. Facility-Administered Medications Ordered in Other Visits: iohexol (OMNIPAQUE) 350 MG/ML injection 70 mL, 70 mL, Intravenous, Once PRN, Reola Calkins, MD, 70 mL at 05/22/12 1157  BP 145/85  Pulse 105  Resp 20  Ht 6' (1.829 m)  Wt 172 lb (78.019 kg)  BMI 23.33 kg/m2  Body mass index is 23.33 kg/(m^2).          Review of Systems denies chest pain, dyspnea on exertion, PND, orthopnea. Patient does not ambulate much and is weak generally. Denies any hemoptysis or lateralizing weakness aphasia or amaurosis fugax.    Objective:   Physical Exam blood pressure 140/85 heart rate 105 respirations 20 Gen.-alert and oriented x3 in no apparent distress HEENT normal for age Lungs no rhonchi or wheezing Cardiovascular regular rhythm no murmurs carotid pulses 3+ palpable no bruits audible Abdomen soft nontender no palpable masses Musculoskeletal free of  major deformities Skin clear -no rashes Neurologic normal Lower extremities 3+ femoral and dorsalis pedis pulses palpable bilaterally with no edema  Today I reviewed the CT angiogram by computer. The sac has slightly enlarged from 5.1-5.6. There continues to be a type II endoleak. There is no evidence of migration of the graft or any type I leaks.      Assessment:     3 years status post aortic stent graft-aortobilateral, and iliac-Gore with persistent type II endoleak despite coiling and glue procedure by Dr. Fredia Sorrow 2 years ago    Plan:     Return in 12 months with repeat CT angiogram to check on status. Patient's condition is frail. Patient's wife understands that there is persistent leak into the aneurysm has  slightly enlarged but is still smaller than the original aneurysm size

## 2012-05-24 ENCOUNTER — Ambulatory Visit (INDEPENDENT_AMBULATORY_CARE_PROVIDER_SITE_OTHER): Payer: Medicare Other | Admitting: General Surgery

## 2012-05-24 ENCOUNTER — Encounter (INDEPENDENT_AMBULATORY_CARE_PROVIDER_SITE_OTHER): Payer: Self-pay | Admitting: General Surgery

## 2012-05-24 VITALS — BP 142/78 | HR 92 | Temp 97.3°F | Resp 16 | Ht 72.0 in | Wt 168.0 lb

## 2012-05-24 DIAGNOSIS — K8020 Calculus of gallbladder without cholecystitis without obstruction: Secondary | ICD-10-CM

## 2012-05-24 NOTE — Patient Instructions (Signed)
You have recently undergone ERCP to remove stones from your main bile duct. You appear to have recovered from that without any complication.  You still have stones in your gallbladder, and there is a significant potential that they will cause further medical problems in the future. There is no emergency, but you are advised to strongly consider elective cholecystectomy.  You will be scheduled for laparoscopic cholecystectomy with cholangiogram in the near future. We will ask your primary doctor, Dr. [name] for medical clearance.   Laparoscopic Cholecystectomy Laparoscopic cholecystectomy is surgery to remove the gallbladder. The gallbladder is located slightly to the right of center in the abdomen, behind the liver. It is a concentrating and storage sac for the bile produced in the liver. Bile aids in the digestion and absorption of fats. Gallbladder disease (cholecystitis) is an inflammation of your gallbladder. This condition is usually caused by a buildup of gallstones (cholelithiasis) in your gallbladder. Gallstones can block the flow of bile, resulting in inflammation and pain. In severe cases, emergency surgery may be required. When emergency surgery is not required, you will have time to prepare for the procedure. Laparoscopic surgery is an alternative to open surgery. Laparoscopic surgery usually has a shorter recovery time. Your common bile duct may also need to be examined and explored. Your caregiver will discuss this with you if he or she feels this should be done. If stones are found in the common bile duct, they may be removed. LET YOUR CAREGIVER KNOW ABOUT:  Allergies to food or medicine.   Medicines taken, including vitamins, herbs, eyedrops, over-the-counter medicines, and creams.   Use of steroids (by mouth or creams).   Previous problems with anesthetics or numbing medicines.   History of bleeding problems or blood clots.   Previous surgery.   Other health problems,  including diabetes and kidney problems.   Possibility of pregnancy, if this applies.  RISKS AND COMPLICATIONS All surgery is associated with risks. Some problems that may occur following this procedure include:  Infection.   Damage to the common bile duct, nerves, arteries, veins, or other internal organs such as the stomach or intestines.   Bleeding.   A stone may remain in the common bile duct.  BEFORE THE PROCEDURE  Do not take aspirin for 3 days prior to surgery or blood thinners for 1 week prior to surgery.   Do not eat or drink anything after midnight the night before surgery.   Let your caregiver know if you develop a cold or other infectious problem prior to surgery.   You should be present 60 minutes before the procedure or as directed.  PROCEDURE  You will be given medicine that makes you sleep (general anesthetic). When you are asleep, your surgeon will make several small cuts (incisions) in your abdomen. One of these incisions is used to insert a small, lighted scope (laparoscope) into the abdomen. The laparoscope helps the surgeon see into your abdomen. Carbon dioxide gas will be pumped into your abdomen. The gas allows more room for the surgeon to perform your surgery. Other operating instruments are inserted through the other incisions. Laparoscopic procedures may not be appropriate when:  There is major scarring from previous surgery.   The gallbladder is extremely inflamed.   There are bleeding disorders or unexpected cirrhosis of the liver.   A pregnancy is near term.   Other conditions make the laparoscopic procedure impossible.  If your surgeon feels it is not safe to continue with a laparoscopic procedure,  he or she will perform an open abdominal procedure. In this case, the surgeon will make an incision to open the abdomen. This gives the surgeon a larger view and field to work within. This may allow the surgeon to perform procedures that sometimes cannot be  performed with a laparoscope alone. Open surgery has a longer recovery time. AFTER THE PROCEDURE  You will be taken to the recovery area where a nurse will watch and check your progress.   You may be allowed to go home the same day.   Do not resume physical activities until directed by your caregiver.   You may resume a normal diet and activities as directed.  Document Released: 09/12/2005 Document Revised: 09/01/2011 Document Reviewed: 02/25/2011 [facility] Patient Information 2012 [place], [place].

## 2012-05-24 NOTE — Progress Notes (Signed)
Patient ID: Paul Fisher, male   DOB: 05-05-1924, 78 y.o.   MRN: [id number]  Chief Complaint  Patient presents with  . Pre-op Exam    chole    HPI Paul Fisher is a 78 y.o. male.  He is referred by Dr. Melvia Fisher for consideration of elective cholecystectomy. His primary care physician is Dr. Lindwood Fisher in [place].  In July of this year, the patient developed problems with recurrent nausea, low-volume vomiting, fever and chills, dark urine but no significant abdominal pain. He was found to have elevated liver function tests. Ultrasound performed on July 26 showed a 5 mm stone in the gallbladder and a dilated common bile duct. On July 31 ERCP showed multiple common bile duct stones. Sphincterotomy was performed and multiple stones were retrieved. His pancreatic duct was normal. He recovered from that uneventfully. His nausea and vomiting and chills have resolved. His dark urine  is now resolved and he is doing well. Dr. Arlyce Fisher referred him for consideration of elective cholecystectomy to remove the source of his stones.  He has had aortic stent graft placed in 2010. He had an endo-leak repair in June of 2013. He is followed by Paul Fisher. Recent CT angiogram showed a small persistent endo-leak but no hemorrhage. According to the patient and his wife Paul Fisher told him to return in one year for further evaluation. He's had colonoscopy in [place] periodically. He has a pacemaker. He's had seed implant to treat prostate cancer. He denies pulmonary disease. Denies cardiac disease.  He lives with his wife. He is somewhat deconditioned and weak but still ambulates with a walker around the house. He has stopped driving. HPI  Past Medical History  Diagnosis Date  . Hyperlipidemia   . Hypertension   . Depression   . Anxiety   . Cancer prostate  . Stroke   . Pacemaker 09/2010    [name] [place]  . Abdominal aortic aneurysm   . Coronary artery disease   . GERD (gastroesophageal  reflux disease)     Past Surgical History  Procedure Date  . Prostate cancer seed implant   . Arotic stent     Paul Fisher  . Pacemaker insertion 01/12/2011    [facility] mod# [id number], serial # [id number]  . Tonsillectomy   . Ercp 04/25/2012    Procedure: ENDOSCOPIC RETROGRADE CHOLANGIOPANCREATOGRAPHY (ERCP);  Surgeon: Louis Meckel, MD;  Location: [facility];  Service: Gastroenterology;  Laterality: [name];    Family History  Problem Relation Age of Onset  . Colon cancer Neg Hx   . Diabetes Father   . Kidney failure Mother     born with only one    Social History History  Substance Use Topics  . Smoking status: Former Smoker    Types: Cigarettes    Quit date: 05/05/1993  . Smokeless tobacco: Never Used  . Alcohol Use: Yes     occasional    Allergies  Allergen Reactions  . Penicillins Swelling    "knot on head" per wife    Current Outpatient Prescriptions  Medication Sig Dispense Refill  . Cholecalciferol (VITAMIN D-3 PO) Take 1,000 Units by mouth daily.      . Cyanocobalamin (VITAMIN B-12 IJ) Inject as directed every 30 (thirty) days.        . DULoxetine (CYMBALTA) 60 MG capsule Take 60 mg by mouth daily.        . Magnesium 400 MG CAPS Take 1 capsule by mouth  3 (three) times daily.      . Nutritional Supplements (EQUATE PO) Take 20 mg by mouth daily.      . potassium chloride SA (K-DUR,KLOR-CON) 20 MEQ tablet Take 20 mEq by mouth daily.      . Probiotic Product (ALIGN PO) Take 1 tablet by mouth daily.      . ramipril (ALTACE) 5 MG capsule Take 5 mg by mouth daily.      [name] aspirin 81 MG EC tablet Take 81 mg by mouth daily.        [name] esomeprazole (NEXIUM) 40 MG capsule Take 40 mg by mouth daily before breakfast.        Review of Systems Review of Systems  Constitutional: Positive for fever. Negative for chills and unexpected weight change.  HENT: Negative for hearing loss, congestion, sore throat, trouble swallowing and voice change.   Eyes: Negative for visual  disturbance.  Respiratory: Negative for cough and wheezing.   Cardiovascular: Negative for chest pain, palpitations and leg swelling.  Gastrointestinal: Positive for nausea. Negative for vomiting, abdominal pain, diarrhea, constipation, blood in stool, abdominal distention, anal bleeding and rectal pain.  Genitourinary: Negative for hematuria and difficulty urinating.  Musculoskeletal: Negative for arthralgias.  Skin: Negative for rash and wound.  Neurological: Positive for weakness. Negative for seizures, syncope and headaches.  Hematological: Negative for adenopathy. Does not bruise/bleed easily.  Psychiatric/Behavioral: Negative for confusion.    Blood pressure 142/78, pulse 92, temperature 97.3 F (36.3 C), temperature source Temporal, resp. rate 16, height 6' (1.829 m), weight 168 lb (76.204 kg).  Physical Exam Physical Exam  Data Reviewed Discussion with Dr. Arlyce Fisher. ERCP  reviewed. Ultrasound, ERCP, and CT angiogram reviewed..  Assessment    Chronic cholecystitis with cholelithiasis. Collective cholecystectomy is offered and advised due to the risk of future complications  Reason history of cholangitis requiring ERCP, sphincterotomy, and removal of common bile duct stones. He has recovered from that  History aortic stent endovascular graft placed 2010. History contained in the leak repaired, followed by Paul Fisher  Chronic chronic kidney disease with creatinine 1.75  Pacemaker  History seed implant for prostate cancer  Mild deconditioning requiring walker    Plan    I had a  long discussion with the patient and his wife. Because of his normal cardiac, pulmonary and mental status I felt it was appropriate to offer cholecystectomy to him. Risks and benefits of non-operative management versus cholecystectomy were discussed. At the end of the conversation and they wanted to be considered for elective cholecystectomy.  We will ask his primary care physician, Dr. Lindwood Fisher in [place] to give medical clearance  Will schedule for elective laparoscopic cholecystectomy with cholangiogram in the future. We'll do this at [facility] hospital. He will need to stay overnight to for observation  I discussed the indications, details, techniques, and numerous risks with the patient and his wife. They understand these issues. Her questions were answered. They agree with this plan.       Angelia Mould. Derrell Lolling, M.D., [facility] General and Minimally invasive Surgery Breast and Colorectal Surgery Office:   [phone] Pager:   [phone]  05/24/2012, 11:25 AM

## 2012-05-24 NOTE — Progress Notes (Signed)
Faxed request for medical clearance with today's office notes to the attention of Dr. [name] in [place] fax # [phone]. Contact number [phone].

## 2012-06-28 ENCOUNTER — Encounter (HOSPITAL_COMMUNITY): Payer: Self-pay | Admitting: Pharmacy Technician

## 2012-07-02 ENCOUNTER — Encounter (HOSPITAL_COMMUNITY): Payer: Self-pay

## 2012-07-02 ENCOUNTER — Encounter (HOSPITAL_COMMUNITY)
Admission: RE | Admit: 2012-07-02 | Discharge: 2012-07-02 | Disposition: A | Discharge status: Home / Self Care | Payer: Medicare Other | Source: Ambulatory Visit | Attending: General Surgery | Admitting: General Surgery

## 2012-07-02 LAB — BASIC METABOLIC PANEL
Calcium: 9.8 mg/dL (ref 8.4–10.5)
GFR calc non Af Amer: 34 mL/min — ABNORMAL LOW (ref >90)
Glucose, Bld: 113 mg/dL — ABNORMAL HIGH (ref 70–99)
Sodium: 132 mEq/L — ABNORMAL LOW (ref 135–145)

## 2012-07-02 LAB — CBC
MCH: 30.6 pg (ref 26.0–34.0)
MCHC: 33.3 g/dL (ref 30.0–36.0)
Platelets: 179 10*3/uL (ref 150–400)
RDW: 15.6 % — ABNORMAL HIGH (ref 11.5–15.5)

## 2012-07-02 LAB — SURGICAL PCR SCREEN
MRSA, PCR: NEGATIVE
Staphylococcus aureus: NEGATIVE

## 2012-07-02 NOTE — Pre-Procedure Instructions (Signed)
20 Paul Fisher  07/02/2012   Your procedure is scheduled on:  07/10/12  Report to [facility] at 830 AM.  Call this number if you have problems the morning of surgery: [phone]   Remember:   Do not eat food:After Midnight.    Take these medicines the morning of surgery with A SIP OF WATER: cymbalta,nexium   Do not wear jewelry, make-up or nail polish.  Do not wear lotions, powders, or perfumes. You may wear deodorant.  Do not shave 48 hours prior to surgery. Men may shave face and neck.  Do not bring valuables to the hospital.  Contacts, dentures or bridgework may not be worn into surgery.  Leave suitcase in the car. After surgery it may be brought to your room.  For patients admitted to the hospital, checkout time is 11:00 AM the day of discharge.   Patients discharged the day of surgery will not be allowed to drive home.  Name and phone number of your driver: family  Special Instructions: Shower using CHG 2 nights before surgery and the night before surgery.  If you shower the day of surgery use CHG.  Use special wash - you have one bottle of CHG for all showers.  You should use approximately 1/3 of the bottle for each shower.   Please read over the following fact sheets that you were given: Pain Booklet, Coughing and Deep Breathing, MRSA Information and Surgical Site Infection Prevention

## 2012-07-02 NOTE — Progress Notes (Signed)
Pacemaker form faxed to Dr Herbert Deaner in [place].  Also pcp  Dr [name] in [place] called for current egk,cxr.

## 2012-07-03 ENCOUNTER — Encounter (HOSPITAL_COMMUNITY): Payer: Self-pay | Admitting: Vascular Surgery

## 2012-07-03 NOTE — Consult Note (Addendum)
Anesthesia chart review: Patient is an 78 year old male scheduled for laparoscopic cholecystectomy on 07/10/2012 by Dr. Derrell Lolling. History includes former smoker, hyperlipidemia, depression, anxiety, prostate cancer s/p seed implant, stroke, CAD (not specified; patient denies history of MI or coronary intervention), recurrent syncope due to sinus node dysfunction and bifascicular block s/p [employer] PPM 01/13/11 ([name] at [facility]), anemia, CKD stage III, hypertension, GERD, AAA s/p aort-bi-common iliac Gore stent graft repair 10/22/08 but with persisent type II endoleak despite coiling and glue procedure (sac measured 4.7 X 5.7 cm on 05/22/12 -- increased from 4.4 X 5.1 cm.  Dr. Hart Rochester recommended one year follow-up.)    PCP is Dr. [name] in [facility] and saw him for a pre-operative evaluation on 9/11/3.  Please refer to his note on chart (and under Media tab).  He was felt to be at "increased risk due to his vascular disease, COPD and chronic kidney disease, and would need overnight monitoring after surgery even for laparoscopic surgery. I'm enclosing a copy of a normal nuclear cardiac stress study and 20/10 cardiology may want to decide whether to repeat that prior to surgery, but his symptoms have not changed since that time."  His last EP Cardiology visit with [name] stated that his pacemaker was functioning normally.  Continued follow-up with their device clinic via transtelephonic monitoring was recommended but otherwise PRN follow-up.  Echo on 01/06/11 [facility]) showed technically very difficult study due to chest wall interference, grossly normal left ventricular systolic function, EF greater than 55%.  Nuclear stress test at [facility] on 10/07/08 was normal showing no evidence of ischemia, normal LV function, EF 78%.  EKG on 04/20/12 showed ST @ 124, right BBB, LAFB, , consider anterior infarct, consider inferior injury--probably not significantly changed from his EKG on  03/25/12.    His most recent CXR, PFTs, and EKG were done on 06/06/12 at Dr. Neita Garnet office.  The CXR was done in his office, and they report the only "report" is what is mentioned in Dr. Neita Garnet pre-operative evaluation letter which states, "Chest x-ray demonstrates no acute process."  EKG revealing right BBB with NSR, PFTs with FVC 2.19 L (61% of predicted) and FEV1 1.19 L (48% predicted).  Labs noted.  Cr 1.71 (stable).  Glucose 113.  H/H 11.0/33.0.  PLT 179.  LFTs were not done at his PAT appointment, but were significantly elevated on 04/20/12 with an AST 410 and ALTL 158.  Follow-up labs at Dr. Neita Garnet office on 06/06/12 showed a normal AST and ALT of 15 and 12, respectively.  I reviewed above with Anesthesiologist Dr. Michelle Piper.  Currently, Dr. Neita Garnet note mentions that cardiology may want to decide whether to repeat a nuclear stress test prior to surgery.  However, I spoke with patient and his wife this evening who report he is not currently being followed by a Cardiologist.  Patient is scheduled to see Dr. [name] for routine follow-up on 07/04/12 @ 1030.  His wife does not think that he has had a cardiac cath.  Unless we receive additional input from Dr. [name] or from pending records, then anesthesia feels patient will need pre-operative cardiology input as well.  I will follow-up requested records and with Dr. [name] tomorrow and update the Anesthesiologist/CCS at that time.  Shonna Chock, PA-C 07/03/12 1800   Addendum: 07/04/12 1730 Dr. [name] called and spoke with me about Mr. Viscuso.  To his knowledge, patient has not had a cardiac cath, but he did report a normal  nuclear study in 2010.  He does think patient is at "increased, but not high risk" for laparoscopic cholecystectomy.  He does not feel strongly that patient needs to see a cardiologist pre-operatively, but would defer to the Anesthesiologists.  I then reviewed Dr. Neita Garnet input with Anesthesiologist Dr. Katrinka Blazing who feels it  would still be best for patient to have a cardiology evaluation pre-operatively.  Dr. Derrell Lolling updated.  He has been released from [facility] and is not otherwise followed by a primary Cardiologist.  I will attempt to assist with arranging a Cardiology appointment at [facility] or [facility] tomorrow.   Addendum: 07/05/12 1645 Patient was seen by Cardiologist Dr. Jens Som today at [facility].  His pre-operative recommendations included: "Patient has no cardiovascular symptoms although her mobility is somewhat limited. He did not have dyspnea or chest pain. Previous nuclear study showed no ischemia and preserved LV function. Laparoscopic cholecystectomy is not a high-risk surgery. I think he can proceed without further cardiac testing. I do think his risk is somewhat elevated because of his age and overall vascular status. This was discussed with family. I do not feel further cardiac testing is indicated preoperatively." I've asked Short Stay nursing staff to follow-up on the pacemaker perioperative RX form from Dr. Neysa Hotter office.

## 2012-07-04 ENCOUNTER — Telehealth (INDEPENDENT_AMBULATORY_CARE_PROVIDER_SITE_OTHER): Payer: Self-pay

## 2012-07-04 ENCOUNTER — Telehealth (INDEPENDENT_AMBULATORY_CARE_PROVIDER_SITE_OTHER): Payer: Self-pay | Admitting: General Surgery

## 2012-07-04 NOTE — Telephone Encounter (Signed)
Revonda Standard PA at [facility] states clearance not from Dr [name] Pts PCP indicates pt needs to probably have cardiac clearance. Pt has pacemaker and has not seen cardiologist in a long time. Pt will need to see cardiologist. Revonda Standard can be reached at [phone] for any questions. I advised her I will send msg to [facility] and [name].

## 2012-07-05 ENCOUNTER — Encounter: Payer: Self-pay | Admitting: Cardiology

## 2012-07-05 ENCOUNTER — Ambulatory Visit (INDEPENDENT_AMBULATORY_CARE_PROVIDER_SITE_OTHER): Payer: Medicare Other | Admitting: Cardiology

## 2012-07-05 VITALS — BP 145/71 | HR 98 | Wt 168.0 lb

## 2012-07-05 DIAGNOSIS — I714 Abdominal aortic aneurysm, without rupture: Secondary | ICD-10-CM

## 2012-07-05 DIAGNOSIS — Z01810 Encounter for preprocedural cardiovascular examination: Secondary | ICD-10-CM | POA: Insufficient documentation

## 2012-07-05 DIAGNOSIS — K8020 Calculus of gallbladder without cholecystitis without obstruction: Secondary | ICD-10-CM

## 2012-07-05 NOTE — Patient Instructions (Addendum)
Your physician recommends that you schedule a follow-up appointment in: AS NEEDED  

## 2012-07-05 NOTE — Assessment & Plan Note (Signed)
Management per general surgery.  

## 2012-07-05 NOTE — Assessment & Plan Note (Signed)
Status post repair

## 2012-07-05 NOTE — Progress Notes (Signed)
HPI: 78 year old male for preoperative evaluation prior to laparoscopic cholecystectomy. Patient does have a history of abdominal aortic aneurysm repair. He had a nuclear study in January of 2010. This showed an ejection fraction of 78% and no ischemia. An echocardiogram in April of 2012 showed an ejection fraction of 55%. There was no significant valvular disease. The patient has limited mobility. However he denies dyspnea on exertion, orthopnea, PND, pedal edema, palpitations, syncope or chest pain.  Current Outpatient Prescriptions  Medication Sig Dispense Refill  . aspirin 81 MG EC tablet Take 81 mg by mouth daily.        . Cholecalciferol (VITAMIN D-3 PO) Take 1,000 Units by mouth daily.      . Cyanocobalamin (VITAMIN B-12 IJ) Inject as directed every 30 (thirty) days.       . DULoxetine (CYMBALTA) 60 MG capsule Take 60 mg by mouth daily.        [name] esomeprazole (NEXIUM) 20 MG capsule Take 40 mg by mouth daily before breakfast.      . Magnesium 400 MG CAPS Take 1 capsule by mouth daily.       . potassium chloride SA (K-DUR,KLOR-CON) 20 MEQ tablet Take 20 mEq by mouth daily.      . Probiotic Product (ALIGN PO) Take 1 tablet by mouth daily.      . ramipril (ALTACE) 5 MG capsule Take 5 mg by mouth daily.        Allergies  Allergen Reactions  . Penicillins Swelling    "knot on head" per wife    Past Medical History  Diagnosis Date  . Hyperlipidemia   . Depression   . Anxiety   . Cancer prostate  . Stroke   . Abdominal aortic aneurysm   . GERD (gastroesophageal reflux disease)   . Hypertension     dr [name]   [place]  . Pacemaker 09/2010    [name] [place]  . Chronic kidney disease     CKD, stage III (06/06/12)    Past Surgical History  Procedure Date  . Prostate cancer seed implant   . Arotic stent     Dr. Hart Rochester  . Pacemaker insertion 01/12/2011    [facility] [employer] mod# [id number], serial # [id number]  . Tonsillectomy   . Ercp 04/25/2012    Procedure: ENDOSCOPIC  RETROGRADE CHOLANGIOPANCREATOGRAPHY (ERCP);  Surgeon: Louis Meckel, MD;  Location: [facility];  Service: Gastroenterology;  Laterality: [name];  . Insert / replace / remove pacemaker     [employer]    History   Social History  . Marital Status: Married    Spouse Name: [name]    Number of Children: 3  . Years of Education: [name]   Occupational History  . retired    Social History Main Topics  . Smoking status: Former Smoker    Types: Cigarettes    Quit date: 05/05/1993  . Smokeless tobacco: Never Used  . Alcohol Use: Yes     2 Drinks per day  . Drug Use: No  . Sexually Active: Not on file   Other Topics Concern  . Not on file   Social History Narrative  . No narrative on file    Family History  Problem Relation Age of Onset  . Colon cancer Neg Hx   . Diabetes Father   . Kidney failure Mother     born with only one    ROS: unsteady gait but no fevers or chills, productive cough, hemoptysis, dysphasia, odynophagia, melena,  hematochezia, dysuria, hematuria, rash, seizure activity, orthopnea, PND, pedal edema, claudication. Remaining systems are negative.  Physical Exam:   Blood pressure 145/71, pulse 98, weight 168 lb (76.204 kg).  General:  Well developed/somewhat frail in NAD Skin warm/dry Patient not depressed No peripheral clubbing Back-normal HEENT-normal/normal eyelids Neck supple/normal carotid upstroke bilaterally; no bruits; no JVD; no thyromegaly chest - CTA/ normal expansion CV - RRR/normal S1 and S2; no murmurs, rubs or gallops;  PMI nondisplaced Abdomen -NT/ND, no HSM, no mass, + bowel sounds, no bruit 2+ femoral pulses, no bruits Ext-no edema, chords, 2+ DP Neuro-grossly nonfocal  ECG 06/06/2012-sinus rhythm, right bundle branch block, cannot rule out prior inferior infarct.

## 2012-07-05 NOTE — Assessment & Plan Note (Signed)
Patient has no cardiovascular symptoms although her mobility is somewhat limited. He did not have dyspnea or chest pain. Previous nuclear study showed no ischemia and preserved LV function. Laparoscopic cholecystectomy is not a high-risk surgery. I think he can proceed without further cardiac testing. I do think his risk is somewhat elevated because of his age and overall vascular status. This was discussed with family. I do not feel further cardiac testing is indicated preoperatively.

## 2012-07-06 NOTE — Progress Notes (Addendum)
Left message with Dr Neysa Hotter office in [place] [phone]) and re-reminded them that the [facility] RX form is needed. 0945 Heard back from Dr Neysa Hotter office and they requested me to re fax the order form, which I did. (fax # [phone])  Spoke with Dr Neita Garnet office and they stated the CXR report is in the office note.  This has been identified and high lighted by Wyatt Portela PA.

## 2012-07-09 MED ORDER — CEFAZOLIN SODIUM-DEXTROSE 2-3 GM-% IV SOLR: 2.0000 g | INTRAVENOUS | Status: DC

## 2012-07-09 MED ORDER — CHLORHEXIDINE GLUCONATE 4 % EX LIQD: 1.0000 "application " | Freq: Once | CUTANEOUS | Status: DC

## 2012-07-09 NOTE — Progress Notes (Signed)
Call to Electrophysiology office, spoke with Tobi Bastos.  She will watch for fax to arrive & deliver to correct person to respond & sign order.

## 2012-07-09 NOTE — Progress Notes (Signed)
Spoke to [facility] regarding pacemaker order form. Stated that they were waiting on MD to sign form and then they would fax form back to [place].

## 2012-07-09 NOTE — H&P (Addendum)
Paul Fisher    MRN: [id number]   Description: 78 year old male  Provider: [name], MD  Department: [facility]      Diagnoses     Cholelithiases   - Primary    574.20         Vitals -   BP Pulse Temp Resp Ht Wt    142/78 92 97.3 F (36.3 C) (Temporal) 16 6' (1.829 m) 168 lb (76.204 kg)    BMI - 22.78 kg/m2                History and physical     [name], MD Patient ID: Paul Fisher, male   DOB: [birth year], 78 y.o.   MRN: [id number]               HPI Paul Fisher is a 79 y.o. male.  He is referred by [name] for consideration of elective cholecystectomy. His primary care physician is Dr. [name] in [place].   In July of this year, the patient developed problems with recurrent nausea, low-volume vomiting, fever and chills, dark urine but no significant abdominal pain. He was found to have elevated liver function tests. Ultrasound performed on July 26 showed a 5 mm stone in the gallbladder and a dilated common bile duct. On July 31 ERCP showed multiple common bile duct stones. Sphincterotomy was performed and multiple stones were retrieved. His pancreatic duct was normal. He recovered from that uneventfully. His nausea and vomiting and chills have resolved. His dark urine  is now resolved and he is doing well. Dr. [name] referred him for consideration of elective cholecystectomy to remove the source of his stones.   He has had aortic stent graft placed in 2010. He had an endo-leak repair in June of 2013. He is followed by J.D. Hart Rochester. Recent CT angiogram showed a small persistent endo-leak but no hemorrhage. According to the patient and his wife Dr. Hart Rochester told him to return in one year for further evaluation. He's had colonoscopy in [place] periodically. He has a pacemaker. He's had seed implant to treat prostate cancer. He denies pulmonary disease. Denies cardiac disease.   He lives with his wife. He is somewhat  deconditioned and weak but still ambulates with a walker around the house. He has stopped driving.     Past Medical History   Diagnosis  Date   .  Hyperlipidemia     .  Hypertension     .  Depression     .  Anxiety     .  Cancer  prostate   .  Stroke     .  Pacemaker  09/2010       [name] [place]   .  Abdominal aortic aneurysm     .  Coronary artery disease     .  GERD (gastroesophageal reflux disease)         Past Surgical History   Procedure  Date   .  Prostate cancer seed implant     .  Arotic stent         Dr. Hart Rochester   .  Pacemaker insertion  01/12/2011       [facility] [employer] mod# [id number], serial # [id number]   .  Tonsillectomy     .  Ercp  04/25/2012       Procedure: ENDOSCOPIC RETROGRADE CHOLANGIOPANCREATOGRAPHY (ERCP);  Surgeon: Louis Meckel, MD;  Location: [facility];  Service:  Gastroenterology;  Laterality: [name];       Family History   Problem  Relation  Age of Onset   .  Colon cancer  Neg Hx     .  Diabetes  Father     .  Kidney failure  Mother         born with only one      Social History History   Substance Use Topics   .  Smoking status:  Former Smoker       Types:  Cigarettes       Quit date:  05/05/1993   .  Smokeless tobacco:  Never Used   .  Alcohol Use:  Yes         occasional       Allergies   Allergen  Reactions   .  Penicillins  Swelling       "knot on head" per wife       Current Outpatient Prescriptions   Medication  Sig  Dispense  Refill   .  Cholecalciferol (VITAMIN D-3 PO)  Take 1,000 Units by mouth daily.         .  Cyanocobalamin (VITAMIN B-12 IJ)  Inject as directed every 30 (thirty) days.           .  DULoxetine (CYMBALTA) 60 MG capsule  Take 60 mg by mouth daily.           .  Magnesium 400 MG CAPS  Take 1 capsule by mouth 3 (three) times daily.         .  Nutritional Supplements (EQUATE PO)  Take 20 mg by mouth daily.         .  potassium chloride SA (K-DUR,KLOR-CON) 20 MEQ tablet  Take 20 mEq by mouth daily.         .   Probiotic Product (ALIGN PO)  Take 1 tablet by mouth daily.         .  ramipril (ALTACE) 5 MG capsule  Take 5 mg by mouth daily.         [name]  aspirin 81 MG EC tablet  Take 81 mg by mouth daily.           [name]  esomeprazole (NEXIUM) 40 MG capsule  Take 40 mg by mouth daily before breakfast.            Review of Systems   Constitutional: Positive for fever. Negative for chills and unexpected weight change.  HENT: Negative for hearing loss, congestion, sore throat, trouble swallowing and voice change.   Eyes: Negative for visual disturbance.  Respiratory: Negative for cough and wheezing.   Cardiovascular: Negative for chest pain, palpitations and leg swelling.  Gastrointestinal: Positive for nausea. Negative for vomiting, abdominal pain, diarrhea, constipation, blood in stool, abdominal distention, anal bleeding and rectal pain.  Genitourinary: Negative for hematuria and difficulty urinating.  Musculoskeletal: Negative for arthralgias.  Skin: Negative for rash and wound.  Neurological: Positive for weakness. Negative for seizures, syncope and headaches.  Hematological: Negative for adenopathy. Does not bruise/bleed easily.  Psychiatric/Behavioral: Negative for confusion.    Blood pressure 142/78, pulse 92, temperature 97.3 F (36.3 C), temperature source Temporal, resp. rate 16, height 6' (1.829 m), weight 168 lb (76.204 kg).    Physical Exam:  Blood pressure 145/71, pulse 98, weight 168 lb (76.204 kg).  General: Well developed/somewhat frail in NAD  Skin warm/dry  Patient not depressed  No peripheral clubbing  Back-normal  HEENT-normal/normal eyelids  Neck supple/normal carotid upstroke bilaterally; no bruits; no JVD; no thyromegaly  chest - CTA/ normal expansion  CV - RRR/normal S1 and S2; no murmurs, rubs or gallops; PMI nondisplaced  Abdomen -NT/ND, no HSM, no mass, + bowel sounds, no bruit  2+ femoral pulses, no bruits  Ext-no edema, chords, 2+ DP  Neuro-grossly  nonfocal    Data Reviewed Discussion with Dr. [name]. ERCP  reviewed. Ultrasound, ERCP, and CT angiogram reviewed..   Assessment Chronic cholecystitis with cholelithiasis. Collective cholecystectomy is offered and advised due to the risk of future complications   Reason history of cholangitis requiring ERCP, sphincterotomy, and removal of common bile duct stones. He has recovered from that   History aortic stent endovascular graft placed 2010. History contained  leak repaired, followed by Dr. Hart Rochester   Chronic chronic kidney disease with creatinine 1.75   Pacemaker   History seed implant for prostate cancer   Mild deconditioning requiring walker   Plan I had a  long discussion with the patient and his wife. Because of his normal cardiac, pulmonary and mental status I felt it was appropriate to offer cholecystectomy to him. Risks and benefits of non-operative management versus cholecystectomy were discussed. At the end of the conversation and they wanted to be considered for elective cholecystectomy.   We will ask his primary care physician, Dr. [name] in [place] to give medical clearance. He might need cardiac clearance, if required by anesthesia.   Will schedule for elective laparoscopic cholecystectomy with cholangiogram in the future. We'll do this at [facility]. He will need to stay overnight to for observation   I discussed the indications, details, techniques, and numerous risks with the patient and his wife. They understand these issues. Her questions were answered. They agree with this plan.       Angelia Mould. Derrell Lolling, M.D., [facility] General and Minimally invasive Surgery Breast and Colorectal Surgery Office:   [phone] Pager:   [phone]

## 2012-07-09 NOTE — Progress Notes (Signed)
Spoke with [facility] [phone] to check on status of periop prescription form for implanted cardiac device. They were waiting for Dr. To sign.  Spoke with Tobi Bastos and refaxed form. She will follow up with appropriate staff.

## 2012-07-10 ENCOUNTER — Encounter (HOSPITAL_COMMUNITY): Payer: Self-pay | Admitting: Vascular Surgery

## 2012-07-10 ENCOUNTER — Ambulatory Visit (HOSPITAL_COMMUNITY): Payer: Medicare Other

## 2012-07-10 ENCOUNTER — Encounter (HOSPITAL_COMMUNITY): Payer: Self-pay | Admitting: *Deleted

## 2012-07-10 ENCOUNTER — Inpatient Hospital Stay (HOSPITAL_COMMUNITY)
Admission: RE | Admit: 2012-07-10 | Discharge: 2012-07-12 | DRG: 418 | Disposition: A | Discharge status: Home Health | Payer: Medicare Other | Source: Ambulatory Visit | Attending: General Surgery | Admitting: General Surgery

## 2012-07-10 ENCOUNTER — Ambulatory Visit (HOSPITAL_COMMUNITY): Payer: Medicare Other | Admitting: Vascular Surgery

## 2012-07-10 ENCOUNTER — Encounter (HOSPITAL_COMMUNITY)
Admission: RE | Disposition: A | Discharge status: Home Health | Payer: Self-pay | Source: Ambulatory Visit | Attending: General Surgery

## 2012-07-10 ENCOUNTER — Encounter (HOSPITAL_COMMUNITY): Payer: Self-pay | Admitting: Physician Assistant

## 2012-07-10 DIAGNOSIS — F3289 Other specified depressive episodes: Secondary | ICD-10-CM | POA: Diagnosis present

## 2012-07-10 DIAGNOSIS — R5381 Other malaise: Secondary | ICD-10-CM | POA: Diagnosis present

## 2012-07-10 DIAGNOSIS — Z8546 Personal history of malignant neoplasm of prostate: Secondary | ICD-10-CM

## 2012-07-10 DIAGNOSIS — K8050 Calculus of bile duct without cholangitis or cholecystitis without obstruction: Secondary | ICD-10-CM | POA: Diagnosis present

## 2012-07-10 DIAGNOSIS — K8064 Calculus of gallbladder and bile duct with chronic cholecystitis without obstruction: Principal | ICD-10-CM | POA: Diagnosis present

## 2012-07-10 DIAGNOSIS — Z79899 Other long term (current) drug therapy: Secondary | ICD-10-CM

## 2012-07-10 DIAGNOSIS — F329 Major depressive disorder, single episode, unspecified: Secondary | ICD-10-CM | POA: Diagnosis present

## 2012-07-10 DIAGNOSIS — Z880 Allergy status to penicillin: Secondary | ICD-10-CM

## 2012-07-10 DIAGNOSIS — F99 Mental disorder, not otherwise specified: Secondary | ICD-10-CM | POA: Diagnosis not present

## 2012-07-10 DIAGNOSIS — I10 Essential (primary) hypertension: Secondary | ICD-10-CM | POA: Diagnosis present

## 2012-07-10 DIAGNOSIS — Z7982 Long term (current) use of aspirin: Secondary | ICD-10-CM

## 2012-07-10 DIAGNOSIS — Z8673 Personal history of transient ischemic attack (TIA), and cerebral infarction without residual deficits: Secondary | ICD-10-CM

## 2012-07-10 DIAGNOSIS — F411 Generalized anxiety disorder: Secondary | ICD-10-CM | POA: Diagnosis present

## 2012-07-10 DIAGNOSIS — Z87891 Personal history of nicotine dependence: Secondary | ICD-10-CM

## 2012-07-10 DIAGNOSIS — E785 Hyperlipidemia, unspecified: Secondary | ICD-10-CM | POA: Diagnosis present

## 2012-07-10 DIAGNOSIS — K8020 Calculus of gallbladder without cholecystitis without obstruction: Secondary | ICD-10-CM | POA: Diagnosis present

## 2012-07-10 DIAGNOSIS — Z833 Family history of diabetes mellitus: Secondary | ICD-10-CM

## 2012-07-10 DIAGNOSIS — K219 Gastro-esophageal reflux disease without esophagitis: Secondary | ICD-10-CM | POA: Diagnosis present

## 2012-07-10 DIAGNOSIS — Z950 Presence of cardiac pacemaker: Secondary | ICD-10-CM

## 2012-07-10 DIAGNOSIS — K8010 Calculus of gallbladder with chronic cholecystitis without obstruction: Secondary | ICD-10-CM

## 2012-07-10 DIAGNOSIS — I2510 Atherosclerotic heart disease of native coronary artery without angina pectoris: Secondary | ICD-10-CM | POA: Diagnosis present

## 2012-07-10 DIAGNOSIS — K8060 Calculus of gallbladder and bile duct with cholecystitis, unspecified, without obstruction: Principal | ICD-10-CM | POA: Diagnosis present

## 2012-07-10 HISTORY — DX: Aortic aneurysm of unspecified site, without rupture: I71.9

## 2012-07-10 HISTORY — PX: CHOLECYSTECTOMY: SHX55

## 2012-07-10 HISTORY — DX: Raynaud's syndrome without gangrene: I73.00

## 2012-07-10 SURGERY — LAPAROSCOPIC CHOLECYSTECTOMY WITH INTRAOPERATIVE CHOLANGIOGRAM
Anesthesia: General | Site: Abdomen | Wound class: Clean Contaminated

## 2012-07-10 MED ORDER — FENTANYL CITRATE 0.05 MG/ML IJ SOLN
INTRAMUSCULAR | Status: DC | PRN
  Administered 2012-07-10 (×2): 50 ug via INTRAVENOUS
  Administered 2012-07-10: 25 ug via INTRAVENOUS

## 2012-07-10 MED ORDER — POTASSIUM CHLORIDE CRYS ER 20 MEQ PO TBCR
20.0000 meq | EXTENDED_RELEASE_TABLET | Freq: Every day | ORAL | Status: DC
  Filled 2012-07-10 (×2): qty 1

## 2012-07-10 MED ORDER — ONDANSETRON HCL 4 MG/2ML IJ SOLN
INTRAMUSCULAR | Status: DC | PRN
  Administered 2012-07-10: 4 mg via INTRAVENOUS

## 2012-07-10 MED ORDER — HYDROCODONE-ACETAMINOPHEN 5-325 MG PO TABS: 1.0000 | ORAL_TABLET | ORAL | Status: DC | PRN

## 2012-07-10 MED ORDER — CIPROFLOXACIN IN D5W 400 MG/200ML IV SOLN
400.0000 mg | Freq: Two times a day (BID) | INTRAVENOUS | Status: DC
  Administered 2012-07-10: 400 mg via INTRAVENOUS
  Filled 2012-07-10: qty 200

## 2012-07-10 MED ORDER — ONDANSETRON HCL 4 MG PO TABS: 4.0000 mg | ORAL_TABLET | Freq: Four times a day (QID) | ORAL | Status: DC | PRN

## 2012-07-10 MED ORDER — BUPIVACAINE-EPINEPHRINE 0.25% -1:200000 IJ SOLN
INTRAMUSCULAR | Status: DC | PRN
  Administered 2012-07-10: 16 mL

## 2012-07-10 MED ORDER — GLYCOPYRROLATE 0.2 MG/ML IJ SOLN
INTRAMUSCULAR | Status: DC | PRN
  Administered 2012-07-10: .6 mg via INTRAVENOUS

## 2012-07-10 MED ORDER — PHENYLEPHRINE HCL 10 MG/ML IJ SOLN
INTRAMUSCULAR | Status: DC | PRN
  Administered 2012-07-10: 80 ug via INTRAVENOUS
  Administered 2012-07-10: 40 ug via INTRAVENOUS

## 2012-07-10 MED ORDER — LACTATED RINGERS IV SOLN
INTRAVENOUS | Status: DC
  Administered 2012-07-10 – 2012-07-11 (×2): via INTRAVENOUS

## 2012-07-10 MED ORDER — DULOXETINE HCL 60 MG PO CPEP
60.0000 mg | ORAL_CAPSULE | Freq: Every day | ORAL | Status: DC
  Administered 2012-07-11 – 2012-07-12 (×2): 60 mg via ORAL
  Filled 2012-07-10 (×2): qty 1

## 2012-07-10 MED ORDER — SODIUM CHLORIDE 0.9 % IV SOLN
INTRAVENOUS | Status: DC | PRN
  Administered 2012-07-10: 11:00:00

## 2012-07-10 MED ORDER — NEOSTIGMINE METHYLSULFATE 1 MG/ML IJ SOLN
INTRAMUSCULAR | Status: DC | PRN
  Administered 2012-07-10: 4 mg via INTRAVENOUS

## 2012-07-10 MED ORDER — LIDOCAINE HCL (CARDIAC) 20 MG/ML IV SOLN
INTRAVENOUS | Status: DC | PRN
  Administered 2012-07-10: 100 mg via INTRAVENOUS

## 2012-07-10 MED ORDER — BUPIVACAINE-EPINEPHRINE PF 0.25-1:200000 % IJ SOLN
INTRAMUSCULAR | Status: AC
  Filled 2012-07-10: qty 30

## 2012-07-10 MED ORDER — OXYCODONE HCL 5 MG PO TABS: 5.0000 mg | ORAL_TABLET | Freq: Once | ORAL | Status: DC | PRN

## 2012-07-10 MED ORDER — CEFAZOLIN SODIUM-DEXTROSE 2-3 GM-% IV SOLR
INTRAVENOUS | Status: AC
  Filled 2012-07-10: qty 50

## 2012-07-10 MED ORDER — PANTOPRAZOLE SODIUM 40 MG PO TBEC
40.0000 mg | DELAYED_RELEASE_TABLET | Freq: Every day | ORAL | Status: DC
  Filled 2012-07-10: qty 1

## 2012-07-10 MED ORDER — VITAMIN D3 25 MCG (1000 UNIT) PO TABS
1000.0000 [IU] | ORAL_TABLET | Freq: Every day | ORAL | Status: DC
  Administered 2012-07-12: 1000 [IU] via ORAL
  Filled 2012-07-10 (×2): qty 1

## 2012-07-10 MED ORDER — ONDANSETRON HCL 4 MG/2ML IJ SOLN: 4.0000 mg | Freq: Four times a day (QID) | INTRAMUSCULAR | Status: DC | PRN

## 2012-07-10 MED ORDER — METOCLOPRAMIDE HCL 5 MG/ML IJ SOLN: 10.0000 mg | Freq: Once | INTRAMUSCULAR | Status: DC | PRN

## 2012-07-10 MED ORDER — MORPHINE SULFATE 2 MG/ML IJ SOLN: 1.0000 mg | INTRAMUSCULAR | Status: DC | PRN

## 2012-07-10 MED ORDER — PROPOFOL 10 MG/ML IV BOLUS
INTRAVENOUS | Status: DC | PRN
  Administered 2012-07-10: 200 mg via INTRAVENOUS

## 2012-07-10 MED ORDER — CIPROFLOXACIN IN D5W 400 MG/200ML IV SOLN: 400.0000 mg | Freq: Two times a day (BID) | INTRAVENOUS | Status: DC

## 2012-07-10 MED ORDER — CIPROFLOXACIN IN D5W 400 MG/200ML IV SOLN
INTRAVENOUS | Status: AC
  Filled 2012-07-10: qty 200

## 2012-07-10 MED ORDER — DEXAMETHASONE SODIUM PHOSPHATE 4 MG/ML IJ SOLN
INTRAMUSCULAR | Status: DC | PRN
  Administered 2012-07-10: 4 mg via INTRAVENOUS

## 2012-07-10 MED ORDER — SODIUM CHLORIDE 0.9 % IR SOLN
Status: DC | PRN
  Administered 2012-07-10: 1

## 2012-07-10 MED ORDER — RAMIPRIL 5 MG PO CAPS
5.0000 mg | ORAL_CAPSULE | Freq: Every day | ORAL | Status: DC
  Administered 2012-07-11 – 2012-07-12 (×2): 5 mg via ORAL
  Filled 2012-07-10 (×2): qty 1

## 2012-07-10 MED ORDER — HYDROMORPHONE HCL PF 1 MG/ML IJ SOLN: 0.2500 mg | INTRAMUSCULAR | Status: DC | PRN

## 2012-07-10 MED ORDER — SODIUM CHLORIDE 0.9 % IV SOLN: INTRAVENOUS | Status: DC

## 2012-07-10 MED ORDER — 0.9 % SODIUM CHLORIDE (POUR BTL) OPTIME
TOPICAL | Status: DC | PRN
  Administered 2012-07-10: 1000 mL

## 2012-07-10 MED ORDER — VITAMIN D-3 125 MCG (5000 UT) PO TABS: ORAL_TABLET | Freq: Every morning | ORAL | Status: DC

## 2012-07-10 MED ORDER — ARTIFICIAL TEARS OP OINT
TOPICAL_OINTMENT | OPHTHALMIC | Status: DC | PRN
  Administered 2012-07-10: 1 via OPHTHALMIC

## 2012-07-10 MED ORDER — ALIGN PO CAPS: 1.0000 | ORAL_CAPSULE | Freq: Every day | ORAL | Status: DC

## 2012-07-10 MED ORDER — OXYCODONE HCL 5 MG/5ML PO SOLN: 5.0000 mg | Freq: Once | ORAL | Status: DC | PRN

## 2012-07-10 MED ORDER — CIPROFLOXACIN IN D5W 400 MG/200ML IV SOLN
400.0000 mg | Freq: Once | INTRAVENOUS | Status: AC
  Administered 2012-07-10 – 2012-07-11 (×2): 400 mg via INTRAVENOUS
  Filled 2012-07-10: qty 200

## 2012-07-10 MED ORDER — LACTATED RINGERS IV SOLN
INTRAVENOUS | Status: DC | PRN
  Administered 2012-07-10: 11:00:00 via INTRAVENOUS

## 2012-07-10 MED ORDER — LACTATED RINGERS IV SOLN
INTRAVENOUS | Status: DC
  Administered 2012-07-10: 10:00:00 via INTRAVENOUS

## 2012-07-10 MED ORDER — DIPHENHYDRAMINE HCL 12.5 MG/5ML PO ELIX
12.5000 mg | ORAL_SOLUTION | Freq: Once | ORAL | Status: AC
  Administered 2012-07-10: 12.5 mg via ORAL
  Filled 2012-07-10: qty 5

## 2012-07-10 MED ORDER — SACCHAROMYCES BOULARDII 250 MG PO CAPS
250.0000 mg | ORAL_CAPSULE | Freq: Every day | ORAL | Status: DC
  Administered 2012-07-12: 250 mg via ORAL
  Filled 2012-07-10 (×2): qty 1

## 2012-07-10 MED ORDER — ROCURONIUM BROMIDE 100 MG/10ML IV SOLN
INTRAVENOUS | Status: DC | PRN
  Administered 2012-07-10: 40 mg via INTRAVENOUS

## 2012-07-10 MED ORDER — POTASSIUM CHLORIDE IN NACL 20-0.9 MEQ/L-% IV SOLN
INTRAVENOUS | Status: DC
  Administered 2012-07-10: 16:00:00 via INTRAVENOUS
  Filled 2012-07-10 (×2): qty 1000

## 2012-07-10 SURGICAL SUPPLY — 43 items
ADH SKN CLS APL DERMABOND .7 (GAUZE/BANDAGES/DRESSINGS) ×1
APPLIER CLIP ROT 10 11.4 M/L (STAPLE) ×2
APR CLP MED LRG 11.4X10 (STAPLE) ×1
BAG SPEC RTRVL LRG 6X4 10 (ENDOMECHANICALS) ×1
BLADE SURG ROTATE 9660 (MISCELLANEOUS) ×1 IMPLANT
CANISTER SUCTION 2500CC (MISCELLANEOUS) ×2 IMPLANT
CHLORAPREP W/TINT 26ML (MISCELLANEOUS) ×2 IMPLANT
CLIP APPLIE ROT 10 11.4 M/L (STAPLE) ×1 IMPLANT
CLOTH BEACON ORANGE TIMEOUT ST (SAFETY) ×2 IMPLANT
COVER MAYO STAND STRL (DRAPES) ×2 IMPLANT
COVER SURGICAL LIGHT HANDLE (MISCELLANEOUS) ×2 IMPLANT
DECANTER SPIKE VIAL GLASS SM (MISCELLANEOUS) ×3 IMPLANT
DERMABOND ADVANCED (GAUZE/BANDAGES/DRESSINGS) ×1
DERMABOND ADVANCED .7 DNX12 (GAUZE/BANDAGES/DRESSINGS) ×1 IMPLANT
DRAPE C-ARM 42X72 X-RAY (DRAPES) ×2 IMPLANT
DRAPE UTILITY 15X26 W/TAPE STR (DRAPE) ×4 IMPLANT
ELECT REM PT RETURN 9FT ADLT (ELECTROSURGICAL) ×2
ELECTRODE REM PT RTRN 9FT ADLT (ELECTROSURGICAL) ×1 IMPLANT
GLOVE BIO SURGEON STRL SZ7 (GLOVE) ×1 IMPLANT
GLOVE BIO SURGEON STRL SZ7.5 (GLOVE) ×2 IMPLANT
GLOVE BIOGEL PI IND STRL 7.5 (GLOVE) IMPLANT
GLOVE BIOGEL PI INDICATOR 7.5 (GLOVE) ×3
GLOVE EUDERMIC 7 POWDERFREE (GLOVE) ×2 IMPLANT
GLOVE SURG ORTHO 8.0 STRL STRW (GLOVE) ×1 IMPLANT
GOWN PREVENTION PLUS XLARGE (GOWN DISPOSABLE) ×3 IMPLANT
GOWN STRL NON-REIN LRG LVL3 (GOWN DISPOSABLE) ×5 IMPLANT
KIT BASIN OR (CUSTOM PROCEDURE TRAY) ×2 IMPLANT
KIT ROOM TURNOVER OR (KITS) ×2 IMPLANT
NS IRRIG 1000ML POUR BTL (IV SOLUTION) ×2 IMPLANT
PAD ARMBOARD 7.5X6 YLW CONV (MISCELLANEOUS) ×3 IMPLANT
POUCH SPECIMEN RETRIEVAL 10MM (ENDOMECHANICALS) ×2 IMPLANT
SCISSORS LAP 5X35 DISP (ENDOMECHANICALS) ×1 IMPLANT
SET CHOLANGIOGRAPH 5 50 .035 (SET/KITS/TRAYS/PACK) ×2 IMPLANT
SET IRRIG TUBING LAPAROSCOPIC (IRRIGATION / IRRIGATOR) ×2 IMPLANT
SLEEVE ENDOPATH XCEL 5M (ENDOMECHANICALS) ×2 IMPLANT
SPECIMEN JAR SMALL (MISCELLANEOUS) ×2 IMPLANT
SUT MNCRL AB 4-0 PS2 18 (SUTURE) ×3 IMPLANT
TOWEL OR 17X24 6PK STRL BLUE (TOWEL DISPOSABLE) ×1 IMPLANT
TOWEL OR 17X26 10 PK STRL BLUE (TOWEL DISPOSABLE) ×2 IMPLANT
TRAY LAPAROSCOPIC (CUSTOM PROCEDURE TRAY) ×2 IMPLANT
TROCAR XCEL BLUNT TIP 100MML (ENDOMECHANICALS) ×2 IMPLANT
TROCAR XCEL NON-BLD 11X100MML (ENDOMECHANICALS) ×2 IMPLANT
TROCAR XCEL NON-BLD 5MMX100MML (ENDOMECHANICALS) ×2 IMPLANT

## 2012-07-10 NOTE — Anesthesia Preprocedure Evaluation (Signed)
Anesthesia Evaluation  Patient identified by MRN, date of birth, ID band Patient awake    Reviewed: Allergy & Precautions, H&P , NPO status , Patient's Chart, lab work & pertinent test results, reviewed documented beta blocker date and time   Airway Mallampati: II TM Distance: >3 FB Neck ROM: full    Dental   Pulmonary neg pulmonary ROS,  breath sounds clear to auscultation        Cardiovascular hypertension, Pt. on medications negative cardio ROS  + pacemaker Rhythm:regular     Neuro/Psych PSYCHIATRIC DISORDERS Anxiety Depression CVA negative neurological ROS  negative psych ROS   GI/Hepatic negative GI ROS, Neg liver ROS, GERD-  Medicated and Controlled,  Endo/Other  negative endocrine ROS  Renal/GU Renal InsufficiencyRenal diseasenegative Renal ROS  negative genitourinary   Musculoskeletal   Abdominal   Peds  Hematology negative hematology ROS (+)   Anesthesia Other Findings See surgeon's H&P   Reproductive/Obstetrics negative OB ROS                           Anesthesia Physical Anesthesia Plan  ASA: III  Anesthesia Plan: General   Post-op Pain Management:    Induction: Intravenous  Airway Management Planned: Oral ETT  Additional Equipment:   Intra-op Plan:   Post-operative Plan: Extubation in OR  Informed Consent: I have reviewed the patients History and Physical, chart, labs and discussed the procedure including the risks, benefits and alternatives for the proposed anesthesia with the patient or authorized representative who has indicated his/her understanding and acceptance.   Dental Advisory Given  Plan Discussed with: CRNA and Surgeon  Anesthesia Plan Comments:         Anesthesia Quick Evaluation

## 2012-07-10 NOTE — Care Management Note (Signed)
    Page 1 of 1   07/12/2012     11:13:58 AM   CARE MANAGEMENT NOTE 07/12/2012  Patient:  Paul Fisher, Paul Fisher   Account Number:  [id number]  Date Initiated:  07/10/2012  Documentation initiated by:  GRAVES-BIGELOW,Takiah Maiden  Subjective/Objective Assessment:   Pt admitted for Laparoscopic cholecystectomy with cholangiogram. Pt is from home with wife and has family support.     Action/Plan:   CM will continue to monitor for disposition needs.   Anticipated DC Date:  07/12/2012   Anticipated DC Plan:        DC Planning Services  CM consult      Choice offered to / List presented to:             Status of service:  Completed, signed off Medicare Important Message given?   (If response is "NO", the following Medicare IM given date fields will be blank) Date Medicare IM given:   Date Additional Medicare IM given:    Discharge Disposition:  HOME W HOME HEALTH SERVICES  Per UR Regulation:  Reviewed for med. necessity/level of care/duration of stay  If discussed at Long Length of Stay Meetings, dates discussed:    Comments:  07-12-12 [address] [place], [place] [phone] CM did speak to pt, wife and son and they are agreeable to [facility] services. CM offerred choice and family chose [facility] for services. CM made referral for servicies listed above. SOC to begin within 24-48 hours post d/c.

## 2012-07-10 NOTE — Consult Note (Signed)
[facility] Consult: 2:02 PM 07/10/2012   Referring Provider: Dr Paul Fisher Primary Care Physician:  Paul Qua, MD Primary Gastroenterologist:  Dr. Melvia Fisher  Reason for Consultation:  Filling defect on intraoperative cholangiogram.   HPI: Paul Fisher is a 78 y.o. male.  Hx of prostate cancer and stenting of infrarenal AAA 2010.   Known gallstones with transient biliary colic treated initially in [place] in 02/2012. They did not pursue surgery as he has multiple comorbid conditions and his sxs resolved.  Pt had had fever and chills a few days before 04/23/12 GI OV.  Repeat ultrasound and labs showed stone in GB neck and 9 mm CBD.  AST 410, ALT 158, alkaline phosphatase 335, total bilirubin 2 .0. Platelet count 133.  Pt was treated with Cipro and followed up with Dr Paul Fisher on 7/29 by which time he was feeling well.  Pt had ERCP on 04/25/12 revealing: 1) Stones, multiple in the common bile duct - s/p sphincterotomy  and stone extraction  2) Periampullary Diverticulum  3) Normal pancreatic duct  RECOMMENDATIONS:  1) cholecystectomy  Pt underwent the laparoscopic cholecystectomy earlier today, 10/15.  On IOC there is "8 or 9 mm irregular filling defect just below the bifurcation of right and left hepatic ducts... anatomy both above and below the filling the defect looked normal... no obstruction to flow of contrast into the duodenum.  Dr Paul Fisher asks Dr Paul Fisher for evaluation and repeat ERCP.  ABX coverage is IV Cipro, as he is PCN allergic. [name]  Pt was confused post-op and pulled out IV, scratched his scalp and caused minor bleeding.  His MS has improved.      Past Medical History  Diagnosis Date  . Hyperlipidemia   . Depression   . Anxiety   . Cancer prostate  . Stroke   . Abdominal aortic aneurysm   . GERD (gastroesophageal reflux disease)   . Hypertension     dr Paul Fisher   [place]  . Pacemaker 09/2010    Dr. Herbert Fisher  [place]  . Chronic kidney disease     CKD, stage III (06/06/12)    Past Surgical History  Procedure Date  . Prostate cancer seed implant   . Arotic stent graft 2010                    Endo leak repair with glue/coiling 02/2012     Dr. Hart Rochester                                        Dr Paul Fisher  . Pacemaker insertion 01/12/2011    [facility] mod# [id number], serial # [id number]  . Tonsillectomy   . ERCP    04/25/2012    Dr Paul Fisher  . Insert / replace / remove pacemaker     [employer]    Prior to Admission medications   Medication Sig Start Date End Date Taking? Authorizing Provider  aspirin 81 MG EC tablet Take 81 mg by mouth daily.     Yes Historical Provider, MD  Cholecalciferol (VITAMIN D-3 PO) Take 1,000 Units by mouth daily.   Yes Historical Provider, MD  Cyanocobalamin (VITAMIN B-12 IJ) Inject as directed every 30 (thirty) days.    Yes Historical Provider, MD  DULoxetine (CYMBALTA) 60 MG capsule Take 60 mg by mouth daily.     Yes Historical Provider, MD  esomeprazole (NEXIUM)  20 MG capsule Take 40 mg by mouth daily before breakfast.   Yes Historical Provider, MD  Magnesium 400 MG CAPS Take 1 capsule by mouth daily.    Yes Historical Provider, MD  potassium chloride SA (K-DUR,KLOR-CON) 20 MEQ tablet Take 20 mEq by mouth daily.   Yes Historical Provider, MD  Probiotic Product (ALIGN PO) Take 1 tablet by mouth daily.   Yes Historical Provider, MD  ramipril (ALTACE) 5 MG capsule Take 5 mg by mouth daily.   Yes Historical Provider, MD    Scheduled Meds:    . bifidobacterium infantis  1 capsule Oral Daily  . ceFAZolin      . ciprofloxacin  400 mg Intravenous Q12H  . ciprofloxacin  400 mg Intravenous Q12H  . DULoxetine  60 mg Oral Daily  . pantoprazole  40 mg Oral Daily  . potassium chloride SA  20 mEq Oral Daily  . ramipril  5 mg Oral Daily   Infusions:    . sodium chloride    . 0.9 % NaCl with KCl 20 mEq / L    . DISCONTD: lactated ringers 50 mL/hr at 07/10/12 0958     PRN Meds: HYDROcodone-acetaminophen, morphine injection, ondansetron (ZOFRAN) IV, ondansetron, DISCONTD: 0.9 % irrigation (POUR BTL), DISCONTD: bupivacaine-EPINEPHrine, DISCONTD:  HYDROmorphone (DILAUDID) injection, DISCONTD: metoCLOPramide, DISCONTD: Omnipaque 300 mg/mL (50 mL) in 0.9% normal saline (50 mL), DISCONTD: oxyCODONE, DISCONTD: oxyCODONE, DISCONTD: sodium chloride irrigation   Allergies as of 06/15/2012 - Review Complete 05/24/2012  Allergen Reaction Noted  . Penicillins Swelling 05/06/2011    Family History  Problem Relation Age of Onset  . Colon cancer Neg Hx   . Diabetes Father   . Kidney failure Mother     born with only one    History   Social History  . Marital Status: Married    Spouse Name: [name]    Number of Children: 3  . Years of Education: [name]   Occupational History  . retired    Social History Main Topics  . Smoking status: Former Smoker    Types: Cigarettes    Quit date: 05/05/1993  . Smokeless tobacco: Never Used  . Alcohol Use: Yes     2 Drinks per day  . Drug Use: No  . Sexually Active: Not on file    REVIEW OF SYSTEMS: Hard of hearing Memory deficits Weighed 170 #/77.2 kg 04/25/2012 No weight loss Walks with aid of walker No hx of MI Otherwise 12 system review was unremarkable.   PHYSICAL EXAM: Vital signs in last 24 hours: Temp:  [97 F (36.1 C)-97.8 F (36.6 C)] 97.8 F (36.6 C) (10/15 1328) Pulse Rate:  [68-87] 71  (10/15 1336) Resp:  [18-51] 18  (10/15 1336) BP: (126-156)/(54-70) 126/65 mmHg (10/15 1335) SpO2:  [96 %-100 %] 99 % (10/15 1336)  General: pleasant, aged WM.  NAD  Head:  No asymmetry or facial swellilng  Eyes:  No icterus Ears:  HOH  Nose:  No discharge Mouth:  Tongue midline.  No exudate, pink clear and moist oral MM Neck:  No mass or JVD Lungs:  Clear.  No dyspnea Heart: RRR.  No MRG Abdomen:  Soft, no tenderness.  Not distended.  Intact, glued incision sites at right and umbilicus.  BS  present Rectal: not done   Musc/Skeltl: arthritic changes in hads and knees.  No joint erythem Extremities:  No pedal/ankle edema  Neurologic:  Pleasant, HOH, moves all 4s, no tremor, oriented to self and to place Skin:  No rash.  Actinic keratosis on head and upper limbs Tattoos:  none Nodes:  No adenopathy at neck or groin   Psych:  Pleasant, not agitated.   Intake/Output from previous day:   Intake/Output this shift: Total I/O In: 1500 [I.V.:1500] Out: 10 [Blood:10]  LAB RESULTS:   Ref. Range 07/02/2012 10:41  WBC Latest Range: 4.0-10.5 K/uL 5.3  RBC Latest Range: 4.22-5.81 MIL/uL 3.60 (L)  Hemoglobin Latest Range: 13.0-17.0 g/dL 46.9 (L)  HCT Latest Range: 39.0-52.0 % 33.0 (L)  MCV Latest Range: 78.0-100.0 fL 91.7  MCH Latest Range: 26.0-34.0 pg 30.6  MCHC Latest Range: 30.0-36.0 g/dL 62.9  RDW Latest Range: 11.5-15.5 % 15.6 (H)  Platelets Latest Range: 150-400 K/uL 179      Ref. Range 07/02/2012 10:41  Sodium Latest Range: 135-145 mEq/L 132 (L)  Potassium Latest Range: 3.5-5.1 mEq/L 4.6  Chloride Latest Range: 96-112 mEq/L 96  CO2 Latest Range: 19-32 mEq/L 26  BUN Latest Range: 6-23 mg/dL 21  Creatinine Latest Range: 0.50-1.35 mg/dL 5.28 (H)  Calcium Latest Range: 8.4-10.5 mg/dL 9.8  GFR calc non Af Amer Latest Range: >90 mL/min 34 (L)  GFR calc Af Amer Latest Range: >90 mL/min 40 (L)  Glucose Latest Range: 70-99 mg/dL 413 (H)  WBC Latest Range: 4.0-10.5 K/uL 5.3  RBC Latest Range: 4.22-5.81 MIL/uL 3.60 (L)  Hemoglobin Latest Range: 13.0-17.0 g/dL 24.4 (L)  HCT Latest Range: 39.0-52.0 % 33.0 (L)  MCV Latest Range: 78.0-100.0 fL 91.7  MCH Latest Range: 26.0-34.0 pg 30.6  MCHC Latest Range: 30.0-36.0 g/dL 01.0  RDW Latest Range: 11.5-15.5 % 15.6 (H)  Platelets Latest Range: 150-400 K/uL 179   LFT   Ref. Range 04/20/2012 18:13  Alkaline Phosphatase Latest Range: 39-117 U/L 335 (H)  Albumin Latest Range: 3.5-5.2 g/dL 3.3 (L)  Lipase Latest Range: 11-59 U/L 22   AST Latest Range: 0-37 U/L 410 (H)  ALT Latest Range: 0-53 U/L 158 (H)  Total Protein Latest Range: 6.0-8.3 g/dL 7.0  Total Bilirubin Latest Range: 0.3-1.2 mg/dL 2.0 (H)   PT/INR Lab Results  Component Value Date   INR 0.9 04/23/2012    RADIOLOGY STUDIES: Dg Cholangiogram Operative 07/10/2012 Findings:  Intraoperative angiographic images of the right upper abdominal quadrant during laparoscopic cholecystectomy are provided for review.  Surgical clips overlie the expected location of the gallbladder fossa.  Contrast injection demonstrates selective cannulation of the central aspect of the cystic duct.  There is brisk passage of contrast through the central aspect of the cystic duct with filling of a mildly dilated common bile duct. There is brisk passage of contrast though the CBD and into the descending portion of the duodenum.  There is minimal reflux of injected contrast into the common hepatic duct with a persistent filling defect noted at the bifurcation of the common hepatic duct worrisome for choledocholithiasis.  IMPRESSION:  Intraoperative cholangiogram as above.  Persistent filling defect at the bifurcation of the common hepatic duct worrisome for a retained stone/choledocholithiasis.  Above findings discussed with Dr. Derrell Fisher at 12:14.   Original Report Authenticated By: Waynard Reeds, M.D.     ENDOSCOPIC STUDIES: Prior colonoscopies in [facility]  IMPRESSION: *  S/p Lap chole 07/10/12 *  Filling defect in region of bifurction of hepatic ducts, suspected retained stone on IOC. ERCP with sphincterotomy and stone extraction 04/25/12 *  Hearing loss  PLAN: *  Has slot for ERCP on 10/16 at 1315.  Continue Cipro in meantime. *  All was d/w pt, the wife  and family members present.     LOS: 0 days   Jennye Moccasin  07/10/2012, 2:02 PM Pager: [phone]      ________________________________________________________________________  [facility] MD note:  I personally examined  the patient, reviewed the data and agree with the assessment and plan described above.  Very nice man with 3 children, their spouses and his wife in the room today.  Filling defect on IOC today, likely retained CBD stone.  He is on schedule for ERCP tomorrow in OR.   Rob Bunting, MD [facility] Pager [phone]

## 2012-07-10 NOTE — Progress Notes (Signed)
Called into pt. Room per family, welts/redness on face noted. Only medication given/running-NS with Kcl. IV stopped immediately. Called pharmacy. Paged on-call MD. Awaiting for call back. Continue to monitor, pt. currently stable.

## 2012-07-10 NOTE — Transfer of Care (Signed)
Immediate Anesthesia Transfer of Care Note  Patient: Paul Fisher  Procedure(s) Performed: Procedure(s) (LRB) with comments: LAPAROSCOPIC CHOLECYSTECTOMY WITH INTRAOPERATIVE CHOLANGIOGRAM ([name])  Patient Location: PACU  Anesthesia Type: General  Level of Consciousness: awake, alert , oriented and sedated  Airway & Oxygen Therapy: Patient Spontanous Breathing and Patient connected to nasal cannula oxygen  Post-op Assessment: Report given to PACU RN, Post -op Vital signs reviewed and stable and Patient moving all extremities  Post vital signs: Reviewed and stable  Complications: No apparent anesthesia complications

## 2012-07-10 NOTE — Interval H&P Note (Signed)
History and Physical Interval Note:  07/10/2012 10:11 AM  Paul Fisher  has presented today for surgery, with the diagnosis of gallstones  The goals and the various methods of treatment have been discussed with the patient and family. After consideration of risks, benefits and other options for treatment, the patient has consented to  Procedure(s) (LRB) with comments: LAPAROSCOPIC CHOLECYSTECTOMY WITH INTRAOPERATIVE CHOLANGIOGRAM ([name]) , possible open as a surgical intervention .  The patient's history has been reviewed, patient examined today,  no change in status, stable for surgery.  I have reviewed the patient's chart and labs.  Questions were answered to the patient's satisfaction.     [name]

## 2012-07-10 NOTE — Op Note (Signed)
Patient Name:           Paul Fisher   Date of Surgery:        07/10/2012  Pre op Diagnosis:      Chronic cholecystitis with cholelithiasis, choledocholithiasis with recent episode of cholangitis requiring ERCP with sphincterotomy  Post op Diagnosis:    Same. Retained common hepatic duct stone  Procedure:                 Laparoscopic cholecystectomy with cholangiogram  Surgeon:                     Angelia Mould. Derrell Lolling, M.D., FACS  Assistant:                      Darnell Level, M.D., FACS  Operative Indications:   Paul Fisher is a 78 y.o. male. He was referred by [name] for consideration of elective cholecystectomy. His primary care physician is Dr. [name] in [place] city.  In July of this year, the patient developed problems with recurrent nausea, low-volume vomiting, fever and chills, dark urine but no significant abdominal pain. He was found to have elevated liver function tests. Ultrasound performed on July 26 showed a 5 mm stone in the gallbladder and a dilated common bile duct. On July 31 ERCP showed multiple common bile duct stones. Sphincterotomy was performed and multiple stones were retrieved. His pancreatic duct was normal. He recovered from that uneventfully. His nausea and vomiting and chills have resolved. His dark urine is now resolved and he is doing well. Dr. [name] referred him for consideration of elective cholecystectomy to remove the source of his stones.  He has had aortic stent graft placed in 2010. He had an endo-leak repair in [place] of 2013. He is followed by J.D. Hart Rochester. Recent CT angiogram showed a small persistent endo-leak but no hemorrhage. According to the patient and his wife Dr. Hart Rochester told him to return in one year for further evaluation. He's had colonoscopy in [place] periodically. He has a pacemaker. He's had seed implant to treat prostate cancer. He denies pulmonary disease. Denies cardiac disease. He underwent cardiac evaluation by Dr. Jens Som  preop. He is brought to the operating room electively    Operative Findings:       The gallbladder was chronically inflamed  and discolored, But was relatively thin-walled. The liver was soft and quite fragile. The peritoneal surfaces, stomach, duodenum, and omentum looked normal. The cholangiogram showed normal intrahepatic and extrahepatic biliary anatomy, good flow of contrast into the duodenum. There was an irregular filling defect just below the bifurcation of the common hepatic duct into right and left ducts. This did not move much and is considered to be a retained common hepatic duct stone. This was discussed with radiology and with [name].  Procedure in Detail:          Following the induction of general endotracheal anesthesia the patient's abdomen was prepped and draped in a sterile fashion. Intravenous antibiotics were given. Surgical time out was performed. 0.5% Marcaine with epinephrine was used as a local infiltration anesthetic.  A vertical incision was made in the lower rim of the umbilicus. The fascia was incised in the midline and the abdominal cavity entered under direct vision with open technique. An 11 mm Hassan trocar was inserted and secured the pursestring suture of 0 Vicryl. Pneumoperitoneum was created, video camera was inserted. An 11 mm trocar was  placed in the subxiphoid region and two 5 mm trocars placed in the right upper quadrant. Patient was positioned. Very large omentum was pulled down off of the liver and we could see the edge of the liver and the gallbladder quite easily. We elevated the gallbladder. This  was  fragile. One small puncture of the fundus of the gallbladder and it drained some white bile. We dissected out the cystic duct and cystic artery. We isolated the cystic artery as it went on the wall of the gallbladder, secured with multiple hemoclips and divided it. We then created a large window behind the cystic duct until  we had a very good critical  view. A cholangiocatheter was inserted into the cystic duct. Initially I actually passed the cholangiocatheter through the back wall of the cystic duct and then reinserted it distally and placed a clip and we got good closure of the cystic duct. A cholangiogram was performed using the C-arm. The cholangiogram showed an 8 or 9 mm irregular filling defect just below the bifurcation of right and left hepatic ducts. There were no other filling defects. The anatomy both above and below the filling the defect looked normal. There was no obstruction to flow of contrast into the duodenum. We then removed the cholangiogram catheter. We secured the cystic duct with multiple clips being careful to a say below the back wall defect and  divided the cystic duct.  I discussed the cholangiogram with the Dr. Grace Isaac in radiology with [name], who plans to followup and decide about the appropriateness of repeat biliary endoscopy.  The gallbladder was dissected from its bed with electrocautery, placed in a specimen bag removed. The operative field was inspected carefully. There was no bleeding or bile leak. We irrigated that out to the subhepatic space and subphrenic space and the irrigation fluid remained clear. We did not think the drainage was necessary. The trocars were removed. Pneumoperitoneum was released. There was no bleeding. The fascia at the umbilicus was closed with 0 Vicryl sutures and the skin closed with subcuticular sutures of 4-0 Monocryl and Dermabond. Patient tolerated procedure well taken to recovery room in stable condition. EBL 20 cc. Counts correct. Consultations none.     Angelia Mould. Derrell Lolling, M.D., FACS General and Minimally Invasive Surgery Breast and Colorectal Surgery  07/10/2012 12:17 PM

## 2012-07-10 NOTE — Anesthesia Postprocedure Evaluation (Signed)
Anesthesia Post Note  Patient: Paul Fisher  Procedure(s) Performed: Procedure(s) (LRB): LAPAROSCOPIC CHOLECYSTECTOMY WITH INTRAOPERATIVE CHOLANGIOGRAM ([name])  Anesthesia type: general  Patient location: PACU  Post pain: Pain level controlled  Post assessment: Patient's Cardiovascular Status Stable  Last Vitals:  Filed Vitals:   07/10/12 1328  BP:   Pulse:   Temp: 36.6 C  Resp:     Post vital signs: Reviewed and stable  Level of consciousness: sedated  Complications: No apparent anesthesia complications

## 2012-07-10 NOTE — Preoperative (Signed)
Beta Blockers   Reason not to administer Beta Blockers:Not Applicable 

## 2012-07-11 ENCOUNTER — Encounter (HOSPITAL_COMMUNITY): Payer: Self-pay | Admitting: Anesthesiology

## 2012-07-11 ENCOUNTER — Inpatient Hospital Stay (HOSPITAL_COMMUNITY): Payer: Medicare Other

## 2012-07-11 ENCOUNTER — Inpatient Hospital Stay (HOSPITAL_COMMUNITY): Payer: Medicare Other | Admitting: Anesthesiology

## 2012-07-11 ENCOUNTER — Encounter (HOSPITAL_COMMUNITY): Payer: Self-pay | Admitting: Gastroenterology

## 2012-07-11 ENCOUNTER — Encounter (HOSPITAL_COMMUNITY)
Admission: RE | Disposition: A | Discharge status: Home Health | Payer: Self-pay | Source: Ambulatory Visit | Attending: General Surgery

## 2012-07-11 HISTORY — PX: ERCP: SHX5425

## 2012-07-11 LAB — CBC
MCH: 30.4 pg (ref 26.0–34.0)
MCHC: 33.1 g/dL (ref 30.0–36.0)
MCV: 91.7 fL (ref 78.0–100.0)
Platelets: 161 10*3/uL (ref 150–400)
RDW: 15.6 % — ABNORMAL HIGH (ref 11.5–15.5)

## 2012-07-11 LAB — COMPREHENSIVE METABOLIC PANEL
ALT: 172 U/L — ABNORMAL HIGH (ref 0–53)
AST: 209 U/L — ABNORMAL HIGH (ref 0–37)
Albumin: 2.7 g/dL — ABNORMAL LOW (ref 3.5–5.2)
Alkaline Phosphatase: 234 U/L — ABNORMAL HIGH (ref 39–117)
BUN: 21 mg/dL (ref 6–23)
Chloride: 99 mEq/L (ref 96–112)
Potassium: 4.4 mEq/L (ref 3.5–5.1)
Sodium: 132 mEq/L — ABNORMAL LOW (ref 135–145)
Total Bilirubin: 0.7 mg/dL (ref 0.3–1.2)

## 2012-07-11 SURGERY — ERCP, WITH INTERVENTION IF INDICATED
Anesthesia: General

## 2012-07-11 MED ORDER — OXYCODONE HCL 5 MG/5ML PO SOLN: 5.0000 mg | Freq: Once | ORAL | Status: DC | PRN

## 2012-07-11 MED ORDER — ROCURONIUM BROMIDE 100 MG/10ML IV SOLN
INTRAVENOUS | Status: DC | PRN
  Administered 2012-07-11: 80 mg via INTRAVENOUS

## 2012-07-11 MED ORDER — OXYCODONE HCL 5 MG PO TABS: 5.0000 mg | ORAL_TABLET | Freq: Once | ORAL | Status: DC | PRN

## 2012-07-11 MED ORDER — ONDANSETRON HCL 4 MG/2ML IJ SOLN
INTRAMUSCULAR | Status: DC | PRN
  Administered 2012-07-11: 4 mg via INTRAVENOUS

## 2012-07-11 MED ORDER — MORPHINE SULFATE 2 MG/ML IJ SOLN: 1.0000 mg | INTRAMUSCULAR | Status: DC | PRN

## 2012-07-11 MED ORDER — GLUCAGON HCL (RDNA) 1 MG IJ SOLR
0.5000 mg | Freq: Once | INTRAMUSCULAR | Status: AC | PRN
  Administered 2012-07-11: .5 mg via INTRAVENOUS
  Filled 2012-07-11: qty 1

## 2012-07-11 MED ORDER — CIPROFLOXACIN IN D5W 400 MG/200ML IV SOLN
400.0000 mg | Freq: Once | INTRAVENOUS | Status: DC
  Filled 2012-07-11: qty 200

## 2012-07-11 MED ORDER — SODIUM CHLORIDE 0.9 % IV SOLN
INTRAVENOUS | Status: DC | PRN
  Administered 2012-07-11: 14:00:00

## 2012-07-11 MED ORDER — PROPOFOL 10 MG/ML IV BOLUS
INTRAVENOUS | Status: DC | PRN
  Administered 2012-07-11: 120 mg via INTRAVENOUS

## 2012-07-11 MED ORDER — FENTANYL CITRATE 0.05 MG/ML IJ SOLN
INTRAMUSCULAR | Status: DC | PRN
  Administered 2012-07-11 (×3): 50 ug via INTRAVENOUS

## 2012-07-11 MED ORDER — EPHEDRINE SULFATE 50 MG/ML IJ SOLN
INTRAMUSCULAR | Status: DC | PRN
  Administered 2012-07-11 (×3): 10 mg via INTRAVENOUS

## 2012-07-11 MED ORDER — PHENYLEPHRINE HCL 10 MG/ML IJ SOLN
INTRAMUSCULAR | Status: DC | PRN
  Administered 2012-07-11: 80 ug via INTRAVENOUS
  Administered 2012-07-11: 120 ug via INTRAVENOUS

## 2012-07-11 MED ORDER — LACTATED RINGERS IV SOLN
INTRAVENOUS | Status: DC | PRN
  Administered 2012-07-11 (×2): via INTRAVENOUS

## 2012-07-11 MED ORDER — MIDAZOLAM HCL 5 MG/5ML IJ SOLN
INTRAMUSCULAR | Status: DC | PRN
  Administered 2012-07-11: 1 mg via INTRAVENOUS

## 2012-07-11 MED ORDER — MIDAZOLAM HCL 2 MG/2ML IJ SOLN: 1.0000 mg | INTRAMUSCULAR | Status: DC | PRN

## 2012-07-11 MED ORDER — PROMETHAZINE HCL 25 MG/ML IJ SOLN: 6.2500 mg | INTRAMUSCULAR | Status: DC | PRN

## 2012-07-11 MED ORDER — FENTANYL CITRATE 0.05 MG/ML IJ SOLN: 50.0000 ug | Freq: Once | INTRAMUSCULAR | Status: DC

## 2012-07-11 MED ORDER — LIDOCAINE HCL (CARDIAC) 20 MG/ML IV SOLN
INTRAVENOUS | Status: DC | PRN
  Administered 2012-07-11: 100 mg via INTRAVENOUS

## 2012-07-11 NOTE — H&P (View-Only) (Signed)
1 Day Post-Op  Subjective: Alert. Mental status normal and at baseline. Looks pretty good.Able to void with good urine output. Vital signs stable. Heart rate 67-87. On telemetry.  Appreciate GI consultation by Dr. Christella Hartigan. Followup ERCP planned for 1:00 PM today for retained CBD stone.  Requires support and assistance to ambulate to the bathroom.  Objective: Vital signs in last 24 hours: Temp:  [97 F (36.1 C)-98.2 F (36.8 C)] 98.2 F (36.8 C) (10/16 0500) Pulse Rate:  [67-87] 72  (10/16 0500) Resp:  [16-51] 18  (10/16 0500) BP: (126-172)/(54-92) 148/74 mmHg (10/16 0500) SpO2:  [94 %-100 %] 95 % (10/16 0500) Weight:  [165 lb (74.844 kg)] 165 lb (74.844 kg) (10/15 1426) Last BM Date: 07/09/12  Intake/Output from previous day: 10/15 0701 - 10/16 0700 In: 1500 [I.V.:1500] Out: 985 [Urine:975; Blood:10] Intake/Output this shift: Total I/O In: -  Out: 775 [Urine:775]  General appearance: alert. Appropriate. Friendly. No distress. Resp: clear to auscultation bilaterally GI: soft. Appropriate mild incisional tenderness. Wounds are okay.  Lab Results:  No results found for this or any previous visit (from the past 24 hour(s)).   Studies/Results: @RISRSLT24 @     . ceFAZolin      . cholecalciferol  1,000 Units Oral Daily  . ciprofloxacin  400 mg Intravenous Once  . diphenhydrAMINE  12.5 mg Oral Once  . DULoxetine  60 mg Oral Daily  . pantoprazole  40 mg Oral Daily  . potassium chloride SA  20 mEq Oral Daily  . ramipril  5 mg Oral Daily  . saccharomyces boulardii  250 mg Oral Daily  . DISCONTD: bifidobacterium infantis  1 capsule Oral Daily  . DISCONTD: chlorhexidine  1 application Topical Once  . DISCONTD: ciprofloxacin  400 mg Intravenous Q12H  . DISCONTD: ciprofloxacin  400 mg Intravenous Q12H  . DISCONTD: Vitamin D-3   Oral q morning - 10a     Assessment/Plan: s/p Procedure(s): LAPAROSCOPIC CHOLECYSTECTOMY WITH INTRAOPERATIVE CHOLANGIOGRAM  POD #1.  Laparoscopic cholecystectomy and cholangiogram. Stable. No apparent surgical complications. Holding diet for anticipated ERCP today.  Retained Choledocholithiasis- seen on IOC yesterday. For ERCP at 1:00 PM today. History ERCP with CBD stone removal 04/25/2012. Morning lab work pending  Chronic deconditioning. We'll ask for PT and OT evaluation. Anticipate discharge home with a walker and family support.   Other comorbidities: History aortic stent endovascular graft placed 2010, history contained endo- leak repaired, followed by Dr. Hart Rochester   chronic kidney disease with creatinine 1.75 at baseline CAD and pacemaker. On telemetry. Followed by Dr. [name] in [place] city. Preop cardiac evaluation by Dr. Olga Millers.  Status change to inpatient as he will likely require hospital stay greater than 2 midnights     LOS: 1 day    Glorianne Proctor M. Derrell Lolling, M.D., [facility] General and Minimally invasive Surgery Breast and Colorectal Surgery Office:   [phone] Pager:   [phone]  07/11/2012  . .prob

## 2012-07-11 NOTE — Anesthesia Preprocedure Evaluation (Signed)
Anesthesia Evaluation  Patient identified by MRN, date of birth, ID band Patient awake    Reviewed: Allergy & Precautions, H&P , NPO status , Patient's Chart, lab work & pertinent test results, reviewed documented beta blocker date and time   Airway Mallampati: II TM Distance: >3 FB Neck ROM: full    Dental   Pulmonary neg pulmonary ROS,  breath sounds clear to auscultation        Cardiovascular hypertension, Pt. on medications + CAD negative cardio ROS  + pacemaker Rhythm:regular Rate:Normal     Neuro/Psych PSYCHIATRIC DISORDERS Anxiety Depression CVA negative neurological ROS  negative psych ROS   GI/Hepatic negative GI ROS, Neg liver ROS, GERD-  Medicated and Controlled,  Endo/Other  negative endocrine ROS  Renal/GU Renal InsufficiencyRenal diseasenegative Renal ROS  negative genitourinary   Musculoskeletal   Abdominal   Peds  Hematology negative hematology ROS (+)   Anesthesia Other Findings See surgeon's H&P   Reproductive/Obstetrics negative OB ROS                           Anesthesia Physical Anesthesia Plan  ASA: III  Anesthesia Plan: General   Post-op Pain Management:    Induction: Intravenous  Airway Management Planned: Oral ETT  Additional Equipment:   Intra-op Plan:   Post-operative Plan: Extubation in OR  Informed Consent: I have reviewed the patients History and Physical, chart, labs and discussed the procedure including the risks, benefits and alternatives for the proposed anesthesia with the patient or authorized representative who has indicated his/her understanding and acceptance.     Plan Discussed with: CRNA and Surgeon  Anesthesia Plan Comments:         Anesthesia Quick Evaluation

## 2012-07-11 NOTE — Progress Notes (Signed)
Physical Therapy Evaluation  Past Medical History  Diagnosis Date  . Hyperlipidemia   . Depression   . Anxiety   . Cancer prostate  . Stroke   . Abdominal aortic aneurysm   . GERD (gastroesophageal reflux disease)   . Hypertension     dr [name]   [place]  . Pacemaker 09/2010    [name] [place]  . Chronic kidney disease     CKD, stage III (06/06/12)  . Aortic aneurysm   . Raynaud disease    Past Surgical History  Procedure Date  . Prostate cancer seed implant   . Arotic stent 2010    Dr. Hart [place]  . Pacemaker insertion 01/12/2011    [facility] mod# [id number], serial # [id number]  . Tonsillectomy   . Ercp 04/25/2012    Procedure: ENDOSCOPIC RETROGRADE CHOLANGIOPANCREATOGRAPHY (ERCP);  Surgeon: Louis Meckel, MD;  Location: [facility];  Service: Gastroenterology;  Laterality: [name];  . Insert / replace / remove pacemaker     [employer]  . Cystoscopy 2010    for eval of hematuria  . Cholecystectomy     07/10/2012    07/11/12 1300  PT Visit Information  Last PT Received On 07/11/12  Assistance Needed +1  PT Time Calculation  PT Start Time 1044  PT Stop Time 1117  PT Time Calculation (min) 33 min  Subjective Data  Subjective I've been married for more than 60 years.    Patient Stated Goal Home  Precautions  Precautions Fall  Restrictions  Weight Bearing Restrictions No  Home Living  Lives With Spouse  Available Help at Discharge Family;Available 24 hours/day  Type of Home House  Home Access Stairs to enter  Entrance Stairs-Number of Steps 2  Entrance Stairs-Rails Right  Home Layout One level  Home Adaptive Equipment Walker - four wheeled  Prior Function  Level of Independence Needs assistance  Needs Assistance Meal Prep;Light Housekeeping;Gait  Meal Prep Maximal  Light Housekeeping Maximal  Gait Assistance Uses [phone]  Able to Take Stairs? Yes  Driving No  Vocation Retired  Geneticist, molecular No difficulties  Cognition  Overall Cognitive  Status Appears within functional limits for tasks assessed/performed  Arousal/Alertness Awake/alert  Orientation Level Appears intact for tasks assessed  Behavior During Session [facility] for tasks performed  Right Lower Extremity Assessment  RLE ROM/Strength/Tone WFL for tasks assessed  RLE Sensation WFL - Light Touch  Left Lower Extremity Assessment  LLE ROM/Strength/Tone WFL for tasks assessed  LLE Sensation WFL - Light Touch  Trunk Assessment  Trunk Assessment Normal  Bed Mobility  Bed Mobility Supine to Sit;Sitting - Scoot to Edge of Bed  Supine to Sit 5: Supervision;With rails  Sitting - Scoot to Edge of Bed 5: Supervision  Details for Bed Mobility Assistance pt needs increased time to complete without A.    Transfers  Transfers Sit to Stand;Stand to Sit  Sit to Stand 4: Min guard;With upper extremity assist;From bed  Stand to Sit 4: Min guard;With upper extremity assist;To chair/3-in-1;With armrests  Details for Transfer Assistance cues for use of UEs and to get closer to recliner prior to sitting.    Ambulation/Gait  Ambulation/Gait Assistance 4: Min guard  Ambulation Distance (Feet) 120 Feet  Assistive device Rolling walker  Ambulation/Gait Assistance Details cues to stay closer to RW, upright posture, attending to task when people talking to him in halls.    Gait Pattern Step-through pattern;Decreased stride length;Trunk flexed  Stairs No  Engineer, drilling  Mobility No  Balance  Balance Assessed No  PT - End of Session  Equipment Utilized During Treatment Gait belt  Activity Tolerance Patient tolerated treatment well  Patient left in chair;with call bell/phone within reach  Nurse Communication Mobility status  PT Assessment  Clinical Impression Statement pt presents with Lap Chole and today for Gall stone removal.  pt moves slowly and a little unsteady, but notes that wife is able to A him at home and would benefit from HHPT.  If wife unable to provide A may  need to consider ST-SNF to Max Independence prior to returning home.    PT Recommendation/Assessment Patient needs continued PT services  PT Problem List Decreased activity tolerance;Decreased balance;Decreased mobility;Decreased knowledge of use of DME  Barriers to Discharge None  PT Therapy Diagnosis  Difficulty walking  PT Plan  PT Frequency Min 3X/week  PT Treatment/Interventions DME instruction;Gait training;Stair training;Functional mobility training;Therapeutic activities;Therapeutic exercise;Balance training;Patient/family education  PT Recommendation  Recommendations for Other Services OT consult  Follow Up Recommendations Home health PT;Supervision/Assistance - 24 hour  Equipment Recommended None recommended by PT  Individuals Consulted  Consulted and Agree with Results and Recommendations Patient  Acute Rehab PT Goals  PT Goal Formulation With patient  Time For Goal Achievement 07/25/12  Potential to Achieve Goals Good  Pt will go Supine/Side to Sit with modified independence  PT Goal: Supine/Side to Sit - Progress Goal set today  Pt will go Sit to Supine/Side with modified independence  PT Goal: Sit to Supine/Side - Progress Goal set today  Pt will go Sit to Stand with modified independence  PT Goal: Sit to Stand - Progress Goal set today  Pt will go Stand to Sit with modified independence  PT Goal: Stand to Sit - Progress Goal set today  Pt will Ambulate >150 feet;with modified independence;with rolling walker  PT Goal: Ambulate - Progress Goal set today  Pt will Go Up / Down Stairs 3-5 stairs;with supervision;with rail(s)  PT Goal: Up/Down Stairs - Progress Goal set today  PT General Charges  $$ ACUTE PT VISIT 1 Procedure  PT Evaluation  $Initial PT Evaluation Tier I 1 Procedure  PT Treatments  $Gait Training 23-37 mins    [place], [place] [phone]

## 2012-07-11 NOTE — Transfer of Care (Signed)
Immediate Anesthesia Transfer of Care Note  Patient: Paul Fisher  Procedure(s) Performed: Procedure(s) (LRB) with comments: ENDOSCOPIC RETROGRADE CHOLANGIOPANCREATOGRAPHY (ERCP) ([name])  Patient Location: PACU  Anesthesia Type: General  Level of Consciousness: awake and oriented  Airway & Oxygen Therapy: Patient Spontanous Breathing and Patient connected to face mask oxygen  Post-op Assessment: Report given to PACU RN, Post -op Vital signs reviewed and stable and Post -op Vital signs reviewed and unstable, Anesthesiologist notified  Post vital signs: Reviewed and stable  Complications: No apparent anesthesia complications

## 2012-07-11 NOTE — Interval H&P Note (Signed)
History and Physical Interval Note:  07/11/2012 12:53 PM  Paul Fisher  has presented today for surgery, with the diagnosis of filling defect on operative cholangiogram.  The various methods of treatment have been discussed with the patient and family. After consideration of risks, benefits and other options for treatment, the patient has consented to  Procedure(s) (LRB) with comments: ENDOSCOPIC RETROGRADE CHOLANGIOPANCREATOGRAPHY (ERCP) ([name]) as a surgical intervention .  The patient's history has been reviewed, patient examined, no change in status, stable for surgery.  I have reviewed the patient's chart and labs.  Questions were answered to the patient's satisfaction.     Rob Bunting

## 2012-07-11 NOTE — Progress Notes (Signed)
1 Day Post-Op  Subjective: Alert. Mental status normal and at baseline. Looks pretty good.Able to void with good urine output. Vital signs stable. Heart rate 67-87. On telemetry.  Appreciate GI consultation by Dr. Jacobs. Followup ERCP planned for 1:00 PM today for retained CBD stone.  Requires support and assistance to ambulate to the bathroom.  Objective: Vital signs in last 24 hours: Temp:  [97 F (36.1 C)-98.2 F (36.8 C)] 98.2 F (36.8 C) (10/16 0500) Pulse Rate:  [67-87] 72  (10/16 0500) Resp:  [16-51] 18  (10/16 0500) BP: (126-172)/(54-92) 148/74 mmHg (10/16 0500) SpO2:  [94 %-100 %] 95 % (10/16 0500) Weight:  [165 lb (74.844 kg)] 165 lb (74.844 kg) (10/15 1426) Last BM Date: 07/09/12  Intake/Output from previous day: 10/15 0701 - 10/16 0700 In: 1500 [I.V.:1500] Out: 985 [Urine:975; Blood:10] Intake/Output this shift: Total I/O In: -  Out: 775 [Urine:775]  General appearance: alert. Appropriate. Friendly. No distress. Resp: clear to auscultation bilaterally GI: soft. Appropriate mild incisional tenderness. Wounds are okay.  Lab Results:  No results found for this or any previous visit (from the past 24 hour(s)).   Studies/Results: @RISRSLT24@     . ceFAZolin      . cholecalciferol  1,000 Units Oral Daily  . ciprofloxacin  400 mg Intravenous Once  . diphenhydrAMINE  12.5 mg Oral Once  . DULoxetine  60 mg Oral Daily  . pantoprazole  40 mg Oral Daily  . potassium chloride SA  20 mEq Oral Daily  . ramipril  5 mg Oral Daily  . saccharomyces boulardii  250 mg Oral Daily  . DISCONTD: bifidobacterium infantis  1 capsule Oral Daily  . DISCONTD: chlorhexidine  1 application Topical Once  . DISCONTD: ciprofloxacin  400 mg Intravenous Q12H  . DISCONTD: ciprofloxacin  400 mg Intravenous Q12H  . DISCONTD: Vitamin D-3   Oral q morning - 10a     Assessment/Plan: s/p Procedure(s): LAPAROSCOPIC CHOLECYSTECTOMY WITH INTRAOPERATIVE CHOLANGIOGRAM  POD #1.  Laparoscopic cholecystectomy and cholangiogram. Stable. No apparent surgical complications. Holding diet for anticipated ERCP today.  Retained Choledocholithiasis- seen on IOC yesterday. For ERCP at 1:00 PM today. History ERCP with CBD stone removal 04/25/2012. Morning lab work pending  Chronic deconditioning. We'll ask for PT and OT evaluation. Anticipate discharge home with a walker and family support.   Other comorbidities: History aortic stent endovascular graft placed 2010, history contained endo- leak repaired, followed by Dr. Lawson   chronic kidney disease with creatinine 1.75 at baseline CAD and pacemaker. On telemetry. Followed by Dr. Hoffman in [place]. Preop cardiac evaluation by Dr. Brian Crenshaw.  Status change to inpatient as he will likely require hospital stay greater than 2 midnights     LOS: 1 day    Lysandra Loughmiller M. Idali Lafever, M.D., FACS [facility] General and Minimally invasive Surgery Breast and Colorectal Surgery Office:   [phone] Pager:   [phone]  07/11/2012  . .prob   

## 2012-07-11 NOTE — Op Note (Signed)
[facility] [address] [place], [address]   ERCP PROCEDURE REPORT  PATIENT: Paul Fisher, Paul Fisher  MR# :[id number] BIRTHDATE: Feb 24, 1924  GENDER: Male ENDOSCOPIST: Rachael Fee, MD PROCEDURE DATE:  07/11/2012 PROCEDURE:   ERCP with removal of calculus/calculi ASA CLASS:   Class IV INDICATIONS:ERCP Dr.  [name] 03/2012 with removal of several CBD stones; lap chole yesterday and IOC showed retained biliary stone. MEDICATIONS: General endotracheal anesthesia (GETA)   cipro 400mg  IV  DESCRIPTION OF PROCEDURE:   After the risks benefits and alternatives of the procedure were thoroughly explained, informed consent was obtained.  Scout film showed clips in RUQ.  The ED-[id number])  endoscope was introduced through the mouth  and advanced to the second portion of the duodenum without detailed examination of the UGI tract. The major papilla was distorted from small periampullary diverticulum and previous biliary sphincterotomy. A 44 Autotome over a 0.035 hydrawire was used to cannulate the biliary tree and contrast was injected. There was no biliary leak, there was a 9mm round mobile filling defect in proximal CBD. This was partially crushed with a biliary basket and then all of the fragments were removed with several sweeps of a biliary balloon. Several soft and hard green stone fragments were delivered into the duodenum. There was no purulence.  Completion, occlusion cholangiogram showed no remaining filling defects. The main pancreatic duct was never injected with dye or cannulated with wire.      The scope was then completely withdrawn from the patient and the procedure terminated.     COMPLICATIONS: No immediate complications.  ENDOSCOPIC IMPRESSION: Retained bile duct stone, removed today with combination of biliary crushing basket and balloon sweeps.  RECOMMENDATIONS: Observe overnight, home tomorrow if he feels  well.    _______________________________ eSigned:  Rachael Fee, MD 07/11/2012 3:06 PM   CC: Claud Kelp, MD; Melvia Heaps, MD

## 2012-07-11 NOTE — Preoperative (Signed)
Beta Blockers   Reason not to administer Beta Blockers:Not Applicable 

## 2012-07-12 ENCOUNTER — Encounter (HOSPITAL_COMMUNITY): Payer: Self-pay | Admitting: Gastroenterology

## 2012-07-12 LAB — COMPREHENSIVE METABOLIC PANEL
ALT: 195 U/L — ABNORMAL HIGH (ref 0–53)
AST: 233 U/L — ABNORMAL HIGH (ref 0–37)
Calcium: 8.3 mg/dL — ABNORMAL LOW (ref 8.4–10.5)
Creatinine, Ser: 1.57 mg/dL — ABNORMAL HIGH (ref 0.50–1.35)
Sodium: 132 mEq/L — ABNORMAL LOW (ref 135–145)
Total Protein: 5.3 g/dL — ABNORMAL LOW (ref 6.0–8.3)

## 2012-07-12 MED ORDER — HYDROCODONE-ACETAMINOPHEN 5-325 MG PO TABS: 1.0000 | ORAL_TABLET | ORAL | Status: DC | PRN

## 2012-07-12 MED ORDER — POLYETHYLENE GLYCOL 3350 17 G PO PACK
17.0000 g | PACK | Freq: Every day | ORAL | Status: DC
  Administered 2012-07-12: 17 g via ORAL
  Filled 2012-07-12: qty 1

## 2012-07-12 NOTE — Anesthesia Postprocedure Evaluation (Signed)
  Anesthesia Post-op Note  Patient: Paul Fisher  Procedure(s) Performed: Procedure(s) (LRB) with comments: ENDOSCOPIC RETROGRADE CHOLANGIOPANCREATOGRAPHY (ERCP) ([name])  Patient Location: PACU  Anesthesia Type: General  Level of Consciousness: awake and alert   Airway and Oxygen Therapy: Patient Spontanous Breathing  Post-op Pain: mild  Post-op Assessment: Post-op Vital signs reviewed, Patient's Cardiovascular Status Stable, Respiratory Function Stable, Patent Airway, No signs of Nausea or vomiting and Pain level controlled  Post-op Vital Signs: stable  Complications: No apparent anesthesia complications

## 2012-07-12 NOTE — Discharge Summary (Signed)
Patient ID: Paul Fisher [id number] 78 y.o. 1924-03-26  07/10/2012  Discharge date and time:   Admitting Physician: [name]  Discharge Physician: [name]  Admission Diagnoses: gallstones filling defect on operative cholangiogram.  Discharge Diagnoses: Chronic cholecystitis with cholelithiasis. Choledocholithiasis History aortic stent graft with history endoleak repair, stable Coronary artery disease Functioning pacemaker History prostate cancer Hypertension Significant deconditioning, homebound, ambulatory with walker.  Operations: Procedure(s): 1)   Laparoscopic cholecystectomy with intraoperative cholangiogram 2)   ENDOSCOPIC RETROGRADE CHOLANGIOPANCREATOGRAPHY (ERCP)  Admission Condition: fair  Discharged Condition: fair  Indication for Admission: Paul Fisher is a 78 y.o. male. He is referred by [name] for consideration of elective cholecystectomy. His primary care physician is Dr. [name] in [place].  In July of this year, the patient developed problems with recurrent nausea, low-volume vomiting, fever and chills, dark urine but no significant abdominal pain. He was found to have elevated liver function tests. Ultrasound performed on July 26 showed a 5 mm stone in the gallbladder and a dilated common bile duct. On July 31 ERCP showed multiple common bile duct stones. Sphincterotomy was performed and multiple stones were retrieved. His pancreatic duct was normal. He recovered from that uneventfully. His nausea and vomiting and chills have resolved. His dark urine is now resolved and he is doing well. Dr. [name] referred him for consideration of elective cholecystectomy to remove the source of his stones.   Hospital Course: On the day of admission the patient underwent laparoscopic cholecystectomy with cholangiogram. The gallbladder was chronically inflamed. The surgery was uneventful. Cholangiogram showed a 9 mm irregular filling  defect at the bifurcation of the right and left hepatic ducts, consistent with retained common bile duct stone. The patient was stable postop without any obvious complications. On postop day 1 Dr. Rob Bunting performed ERCP and retrieved his common bile duct stones through the sphincterotomy. The patient did well and had no complications from that. On postop day 2 the patient was alert, had no pain or nausea and his physical exam showed a benign abdomen. He wanted to go home with his wife. He had been evaluated by physical therapy who recommended home health PT. Home health PT was requested. He was given a prescription for Vicodin for pain. Diet and activities were discussed. He was advised to return to see Dr. Derrell Lolling in 3 weeks.  Consults: GI  Significant Diagnostic Studies: ERCP with stone retrieval.  Treatments: surgery: Laparoscopic cholecystectomy with cholangiogram. Postop ERCP  Disposition: Home  Patient Instructions:   [place], [place]  Home Medication Instructions WUJ:[id number]   Printed on:07/12/12 9562  Medication Information                    DULoxetine (CYMBALTA) 60 MG capsule Take 60 mg by mouth daily.             Cyanocobalamin (VITAMIN B-12 IJ) Inject as directed every 30 (thirty) days.            aspirin 81 MG EC tablet Take 81 mg by mouth daily.             potassium chloride SA (K-DUR,KLOR-CON) 20 MEQ tablet Take 20 mEq by mouth daily.           Cholecalciferol (VITAMIN D-3 PO) Take 1,000 Units by mouth daily.           Magnesium 400 MG CAPS Take 1 capsule by mouth daily.  Probiotic Product (ALIGN PO) Take 1 tablet by mouth daily.           ramipril (ALTACE) 5 MG capsule Take 5 mg by mouth daily.           esomeprazole (NEXIUM) 20 MG capsule Take 40 mg by mouth daily before breakfast.           HYDROcodone-acetaminophen (NORCO/VICODIN) 5-325 MG per tablet Take 1-2 tablets by mouth every 4 (four) hours as needed.             Activity:  activity as tolerated. Use walker to ambulate. No driving. No lifting. Home health physical therapy and home health nursing to evaluate home needs. Diet: cardiac diet Wound Care: none needed  Follow-up:  With Dr. Derrell Lolling in 3 weeks.  Signed: Angelia Mould. Derrell Lolling, M.D., FACS General and minimally invasive surgery Breast and Colorectal Surgery  07/12/2012, 6:58 AM

## 2012-07-12 NOTE — Evaluation (Signed)
Occupational Therapy Evaluation and Discharge Patient Details Name: Paul Fisher MRN: [id number] DOB: 06-Sep-1924 Today's Date: 07/12/2012 Time: 1100-1131 OT Time Calculation (min): 31 min  OT Assessment / Plan / Recommendation Clinical Impression  Pt s/p Laparoscopic cholecystectomy and cholangiogram and ERCP and presents as generally weak. Family to provide assist at home, but recommend HHOT for generalized weakness as well as questionable safety. Pt to d/c this am therefore, will not follow acutely.    OT Assessment  All further OT needs can be met in the next venue of care    Follow Up Recommendations  Home health OT;Supervision/Assistance - 24 hour    Barriers to Discharge      Equipment Recommendations   (to be address by [facility])    Recommendations for Other Services    Frequency       Precautions / Restrictions Precautions Precautions: Fall   Pertinent Vitals/Pain Pt denies any pain at this time.    ADL  Eating/Feeding: Performed;Min guard (pt holds liquid in mouth ? oral apraxia/motor planning?) Where Assessed - Eating/Feeding: Edge of bed Upper Body Dressing: Performed;Minimal assistance;Set up Where Assessed - Upper Body Dressing: Unsupported sitting Lower Body Dressing: Performed;Minimal assistance;Set up Where Assessed - Lower Body Dressing: Supported sit to stand Toilet Transfer: Mining engineer Method: Sit to Barista:  (to/from bed) Toileting - Architect and Hygiene: Simulated;Minimal assistance Where Assessed - Engineer, mining and Hygiene: Sit to stand from 3-in-1 or toilet Equipment Used: Gait belt;Rolling walker Transfers/Ambulation Related to ADLs: Pt initially heavy Min A for sit to stand from bed; once I asked wife to help him to make sure she would be able to do so at home, pt stood with light Min A bordering on Min guard ADL Comments: Pt with questionable oral  apraxia/motor planning issues which family states have been present for "a while now" Case manager notfiied and states she will have HHRN order SLP if necessary    OT Diagnosis: Generalized weakness  OT Problem List: Decreased strength;Decreased activity tolerance;Impaired balance (sitting and/or standing);Decreased safety awareness;Decreased knowledge of use of DME or AE OT Treatment Interventions:     OT Goals    Visit Information  Last OT Received On: 07/12/12 Assistance Needed: +1    Subjective Data  Subjective: I'll do better when I'm at home Patient Stated Goal: Return home with wife assist   Prior Functioning     Home Living Lives With: Spouse Available Help at Discharge: Family;Available 24 hours/day Type of Home: House Home Access: Stairs to enter [employer] of Steps: 2 Entrance Stairs-Rails: Right Home Layout: One level Home Adaptive Equipment: Environmental consultant - four wheeled Prior Function Level of Independence: Needs assistance Needs Assistance: Meal Prep;Light Housekeeping;Gait Meal Prep: Maximal Light Housekeeping: Maximal Gait Assistance: Uses 4WW Able to Take Stairs?: Yes Driving: No Vocation: Retired Musician: No difficulties Dominant Hand: Right         Vision/Perception     Cognition  Overall Cognitive Status: Appears within functional limits for tasks assessed/performed Arousal/Alertness: Awake/alert Orientation Level: Appears intact for tasks assessed Behavior During Session: [facility] for tasks performed    Extremity/Trunk Assessment Right Upper Extremity Assessment RUE ROM/Strength/Tone: WFL for tasks assessed RUE Sensation: WFL - Light Touch RUE Coordination: WFL - gross/fine motor Left Upper Extremity Assessment LUE ROM/Strength/Tone: WFL for tasks assessed LUE Sensation: WFL - Light Touch LUE Coordination: WFL - gross/fine motor     Mobility Transfers Sit to Stand: 4: Min assist;From bed;With upper  extremity  assist Stand to Sit: 4: Min guard;With upper extremity assist;To bed Details for Transfer Assistance: cues for UE placement      Shoulder Instructions     Exercise     Balance     End of Session OT - End of Session Equipment Utilized During Treatment: Gait belt Activity Tolerance: Patient tolerated treatment well Patient left: in bed;with call bell/phone within reach;with family/visitor present;with nursing in room Nurse Communication: Mobility status  GO     Lachelle Rissler 07/12/2012, 12:02 PM

## 2012-07-12 NOTE — Progress Notes (Signed)
[facility]  Patient Status: New  [facility] is providing the following services: RN, PT and OT  If patient discharges after hours, please call [phone].   Lanae Crumbly 07/12/2012, 12:30 PM

## 2012-07-12 NOTE — Progress Notes (Signed)
1 Day Post-Op  Subjective: Alert. No distress. Mental status at baseline. He says he feels good and wants to go home. He states he was told that he could go home today.  ERCP yesterday successful for removal of retained common bile duct stones.  PT recommends HHPT. HHN  would also be advised, at least initially.  Objective: Vital signs in last 24 hours: Temp:  [97 F (36.1 C)-97.7 F (36.5 C)] 97.6 F (36.4 C) (10/17 0500) Pulse Rate:  [64-96] 78  (10/17 0500) Resp:  [14-18] 16  (10/16 1712) BP: (101-139)/(44-78) 110/66 mmHg (10/17 0500) SpO2:  [92 %-100 %] 96 % (10/17 0500) Last BM Date: 07/11/12  Intake/Output from previous day: 10/16 0701 - 10/17 0700 In: 1300 [I.V.:1300] Out: 625 [Urine:625] Intake/Output this shift: Total I/O In: -  Out: 300 [Urine:300]  General appearance: alert. Middle status normal. No distress. Currently. Resp: clear to auscultation bilaterally GI: abdomen soft. Nontender. Wounds clean. Benign postop exam.  Lab Results:  No results found for this or any previous visit (from the past 24 hour(s)).   Studies/Results: @RISRSLT24 @     . cholecalciferol  1,000 Units Oral Daily  . ciprofloxacin  400 mg Intravenous Once  . DULoxetine  60 mg Oral Daily  . pantoprazole  40 mg Oral Daily  . potassium chloride SA  20 mEq Oral Daily  . ramipril  5 mg Oral Daily  . saccharomyces boulardii  250 mg Oral Daily  . DISCONTD: ciprofloxacin  400 mg Intravenous Once  . DISCONTD: fentaNYL  50-100 mcg Intravenous Once     Assessment/Plan: s/p Procedure(s): ENDOSCOPIC RETROGRADE CHOLANGIOPANCREATOGRAPHY (ERCP)  Doing well followed by laparoscopic cholecystectomy, followed by ERCP and removal of residual common bile duct stones.  Will give MiraLAX and see how he does with breakfast. Plan discharge today.  HHPT and HHN to be ordered.  Other comorbidities:  History aortic stent endovascular graft placed 2010, history contained endo- leak repaired,  followed by Dr. Hart Rochester chronic kidney disease with creatinine 1.75 at baseline  CAD and pacemaker. On telemetry. Followed by Dr. [name] in [place]. Preop cardiac evaluation by Dr. Olga Millers.       LOS: 2 days    Abrahm Mancia M. Derrell Lolling, M.D., [facility] General and Minimally invasive Surgery Breast and Colorectal Surgery Office:   [phone] Pager:   [phone]  07/12/2012  . .prob

## 2012-07-30 ENCOUNTER — Ambulatory Visit (INDEPENDENT_AMBULATORY_CARE_PROVIDER_SITE_OTHER): Payer: Medicare Other | Admitting: General Surgery

## 2012-07-30 ENCOUNTER — Encounter (INDEPENDENT_AMBULATORY_CARE_PROVIDER_SITE_OTHER): Payer: Self-pay | Admitting: General Surgery

## 2012-07-30 VITALS — BP 136/82 | HR 74 | Temp 98.0°F | Resp 18 | Ht 72.0 in | Wt 162.8 lb

## 2012-07-30 DIAGNOSIS — K8020 Calculus of gallbladder without cholecystitis without obstruction: Secondary | ICD-10-CM

## 2012-07-30 NOTE — Progress Notes (Signed)
Patient ID: Paul Fisher, male   DOB: [birth year], 78 y.o.   MRN: [id number] History: This gentleman recently developed cholangitis and had ERCP and sphincterotomy for common duct stones by Dr. [name]. He underwent elective laparoscopic cholecystectomy on October 15 which was uneventful, although the cholangiogram showed a retained common hepatic stone. He remained hospitalized and Dr. Christella Hartigan did the followup ERCP and was able to clear the common duct. Since discharge the patient has done well. His appetite has returned. His bowel function is normal. He has no wound problems. No abdominal pain. No fever.  Exam: Patient looks good. He is ambulatory with a walker. His wife is with him. Abdomen soft. Nontender. Trocar sites well healed. Sclera clear. No jaundice.  Assessment: Chronic cholecystitis with cholelithiasis, uneventful recovery following laparoscopic cholecystectomy Choledocholithiasis, status post preop ERCP/ERS/stone removal  and postop ERCP for retained CBD stones. Apparently doing very well now  Plan: Diet and activities discussed. Return to see me when necessary.    Angelia Mould. Derrell Lolling, M.D., [facility] General and Minimally invasive Surgery Breast and Colorectal Surgery Office:   [phone] Pager:   [phone]

## 2012-07-30 NOTE — Patient Instructions (Addendum)
You have done very well following your gallbladder operation. Nothing further needs to be done.  Return to see Dr. Derrell Lolling if further problems arise

## 2012-08-09 ENCOUNTER — Encounter (INDEPENDENT_AMBULATORY_CARE_PROVIDER_SITE_OTHER): Payer: Self-pay | Admitting: General Surgery

## 2012-08-09 NOTE — Progress Notes (Signed)
Faxed signed authorization by Dr. Derrell Lolling to the attention of Dellis Anes at [facility] Fax # [phone] for home health certification/plan of care. Confirmation received.

## 2012-08-31 ENCOUNTER — Encounter (INDEPENDENT_AMBULATORY_CARE_PROVIDER_SITE_OTHER): Payer: Self-pay | Admitting: General Surgery

## 2012-08-31 NOTE — Progress Notes (Signed)
Faxed signed face to face encounter to the attention of Dellis Anes at [facility] to fax # [phone]. Confirmation received. Sent to medical records to be scanned into the chart.

## 2012-09-12 ENCOUNTER — Encounter (INDEPENDENT_AMBULATORY_CARE_PROVIDER_SITE_OTHER): Payer: Self-pay

## 2012-10-24 ENCOUNTER — Encounter (INDEPENDENT_AMBULATORY_CARE_PROVIDER_SITE_OTHER): Payer: Self-pay

## 2012-10-25 ENCOUNTER — Encounter (INDEPENDENT_AMBULATORY_CARE_PROVIDER_SITE_OTHER): Payer: Self-pay | Admitting: General Surgery

## 2012-10-25 NOTE — Progress Notes (Signed)
Faxed signed face-to-face encounter from Dr. Derrell Lolling to the attention of Dellis Anes at [facility] FAX # [phone]. Confirmation received. Signed encounter sent to medical records to be scanned as media.

## 2012-10-30 ENCOUNTER — Encounter (INDEPENDENT_AMBULATORY_CARE_PROVIDER_SITE_OTHER): Payer: Self-pay

## 2013-05-20 ENCOUNTER — Encounter: Payer: Self-pay | Admitting: Vascular Surgery

## 2013-05-21 ENCOUNTER — Ambulatory Visit
Admission: RE | Admit: 2013-05-21 | Discharge: 2013-05-21 | Disposition: A | Discharge status: Home / Self Care | Payer: Medicare Other | Source: Ambulatory Visit | Attending: Vascular Surgery | Admitting: Vascular Surgery

## 2013-05-21 ENCOUNTER — Ambulatory Visit (INDEPENDENT_AMBULATORY_CARE_PROVIDER_SITE_OTHER): Payer: Medicare Other | Admitting: Vascular Surgery

## 2013-05-21 ENCOUNTER — Encounter: Payer: Self-pay | Admitting: Vascular Surgery

## 2013-05-21 ENCOUNTER — Ambulatory Visit: Payer: Medicare Other | Admitting: Vascular Surgery

## 2013-05-21 VITALS — BP 154/81 | HR 103 | Resp 16 | Ht 72.0 in | Wt 162.0 lb

## 2013-05-21 DIAGNOSIS — I714 Abdominal aortic aneurysm, without rupture: Secondary | ICD-10-CM

## 2013-05-21 DIAGNOSIS — Z48812 Encounter for surgical aftercare following surgery on the circulatory system: Secondary | ICD-10-CM

## 2013-05-21 MED ORDER — IOHEXOL 350 MG/ML SOLN
60.0000 mL | Freq: Once | INTRAVENOUS | Status: AC | PRN
  Administered 2013-05-21: 60 mL via INTRAVENOUS

## 2013-05-21 NOTE — Progress Notes (Signed)
Subjective:     Patient ID: Paul Fisher, male   DOB: [birth year], 79 y.o.   MRN: [id number]  HPI this 79 year old male has had a previous aortic stent graft placement for abdominal aortic aneurysm. He developed an endoleak and enlargement of the sac and had a glue procedure performed by Dr. Fredia Sorrow 2 years ago. The aneurysm sac has not done smaller but has enlarged very slightly over the past 2 studies he does have a persistent type II endoleak. The endoleak is less than it was prior to the gluing procedure. Patient denies any abdominal or back symptoms. Appetite is good. His activity level is unchanged.  Past Medical History  Diagnosis Date  . Hyperlipidemia   . Depression   . Anxiety   . Cancer prostate  . Stroke   . Abdominal aortic aneurysm   . GERD (gastroesophageal reflux disease)   . Hypertension     dr [name]   [facility]  . Pacemaker 09/2010    [name] [place]  . Chronic kidney disease     CKD, stage III (06/06/12)  . Aortic aneurysm   . Raynaud disease     History  Substance Use Topics  . Smoking status: Former Smoker    Types: Cigarettes    Quit date: 05/05/1993  . Smokeless tobacco: Never Used  . Alcohol Use: Yes     Comment: 2 Drinks per day    Family History  Problem Relation Age of Onset  . Colon cancer Neg Hx   . Diabetes Father   . Kidney failure Mother     born with only one    Allergies  Allergen Reactions  . Penicillins Swelling    "knot on head" per wife    Current outpatient prescriptions:aspirin 81 MG EC tablet, Take 81 mg by mouth daily.  , Disp: , Rfl: ;  Cholecalciferol (VITAMIN D-3 PO), Take 1,000 Units by mouth daily., Disp: , Rfl: ;  Cyanocobalamin (VITAMIN B-12 IJ), Inject as directed every 30 (thirty) days. , Disp: , Rfl: ;  DULoxetine (CYMBALTA) 60 MG capsule, Take 60 mg by mouth daily.  , Disp: , Rfl:  esomeprazole (NEXIUM) 20 MG capsule, Take 40 mg by mouth daily before breakfast., Disp: , Rfl: ;  Magnesium 400 MG CAPS, Take 1  capsule by mouth daily. , Disp: , Rfl: ;  potassium chloride SA (K-DUR,KLOR-CON) 20 MEQ tablet, Take 20 mEq by mouth daily., Disp: , Rfl: ;  Probiotic Product (ALIGN PO), Take 1 tablet by mouth daily., Disp: , Rfl:  HYDROcodone-acetaminophen (NORCO/VICODIN) 5-325 MG per tablet, Take 1-2 tablets by mouth every 4 (four) hours as needed., Disp: 30 tablet, Rfl: 0;  ramipril (ALTACE) 5 MG capsule, Take 5 mg by mouth daily., Disp: , Rfl:   BP 154/81  Pulse 103  Resp 16  Ht 6' (1.829 m)  Wt 162 lb (73.483 kg)  BMI 21.97 kg/m2  Body mass index is 21.97 kg/(m^2).           Review of Systems denies chest pain, dyspnea on exertion, PND, orthopnea, hemoptysis, claudication. Does have some dementia and is somewhat feeble with his ambulation. Other systems negative review of systems    Objective:   Physical Exam BP 154/81  Pulse 103  Resp 16  Ht 6' (1.829 m)  Wt 162 lb (73.483 kg)  BMI 21.97 kg/m2  Gen.-alert and oriented x3 in no apparent distress-elderly and frail in appearance HEENT normal for age Lungs no rhonchi or wheezing Cardiovascular  regular rhythm no murmurs carotid pulses 3+ palpable no bruits audible Abdomen soft nontender no palpable masses Musculoskeletal free of  major deformities Skin clear -no rashes Neurologic normal Lower extremities 3+ femoral and dorsalis pedis pulses palpable bilaterally with no edema  Today I reviewed the CT angiogram of abdomen and pelvis with by computer which was performed earlier today. This has been interpreted by the radiologist. There is approximate 1 mm enlargement in the size of the sac and there continues to be a type II endoleak. There has been no migration of the graft. There does not appear to be any major endoleak from the proximal or distal attachment sites.       Assessment:     Status post aortic stent graft for infrarenal abdominal aortic aneurysm with previous gluing procedure performed by Dr. Fredia Sorrow for endoleak and  enlargement of the sac-2-3 years ago Persistent type II endoleak with minimal enlargement of sac    Plan:     Return in 9 months for repeat CT angiogram of abdomen and pelvis. Patient developed severe abdominal or back pain report to emergency department. Had discussion with patient's family regarding the fact that the aneurysm sac has very slightly enlarged and the endoleak persists and they are aware of this.

## 2013-05-28 DIAGNOSIS — R55 Syncope and collapse: Secondary | ICD-10-CM

## 2013-05-28 HISTORY — DX: Syncope and collapse: R55

## 2013-05-29 ENCOUNTER — Observation Stay (HOSPITAL_COMMUNITY)
Admission: AD | Admit: 2013-05-29 | Discharge: 2013-05-30 | Disposition: A | Discharge status: Home / Self Care | Payer: Medicare Other | Source: Other Acute Inpatient Hospital | Attending: Internal Medicine | Admitting: Internal Medicine

## 2013-05-29 ENCOUNTER — Encounter (HOSPITAL_COMMUNITY): Payer: Self-pay

## 2013-05-29 DIAGNOSIS — I714 Abdominal aortic aneurysm, without rupture, unspecified: Secondary | ICD-10-CM | POA: Diagnosis present

## 2013-05-29 DIAGNOSIS — Z8546 Personal history of malignant neoplasm of prostate: Secondary | ICD-10-CM | POA: Insufficient documentation

## 2013-05-29 DIAGNOSIS — N183 Chronic kidney disease, stage 3 unspecified: Secondary | ICD-10-CM | POA: Diagnosis present

## 2013-05-29 DIAGNOSIS — E785 Hyperlipidemia, unspecified: Secondary | ICD-10-CM | POA: Insufficient documentation

## 2013-05-29 DIAGNOSIS — Z79899 Other long term (current) drug therapy: Secondary | ICD-10-CM | POA: Insufficient documentation

## 2013-05-29 DIAGNOSIS — Z87891 Personal history of nicotine dependence: Secondary | ICD-10-CM | POA: Insufficient documentation

## 2013-05-29 DIAGNOSIS — F411 Generalized anxiety disorder: Secondary | ICD-10-CM | POA: Insufficient documentation

## 2013-05-29 DIAGNOSIS — R42 Dizziness and giddiness: Secondary | ICD-10-CM | POA: Insufficient documentation

## 2013-05-29 DIAGNOSIS — Z7982 Long term (current) use of aspirin: Secondary | ICD-10-CM | POA: Insufficient documentation

## 2013-05-29 DIAGNOSIS — Z950 Presence of cardiac pacemaker: Secondary | ICD-10-CM | POA: Insufficient documentation

## 2013-05-29 DIAGNOSIS — F329 Major depressive disorder, single episode, unspecified: Secondary | ICD-10-CM | POA: Insufficient documentation

## 2013-05-29 DIAGNOSIS — I10 Essential (primary) hypertension: Secondary | ICD-10-CM | POA: Diagnosis present

## 2013-05-29 DIAGNOSIS — I7300 Raynaud's syndrome without gangrene: Secondary | ICD-10-CM | POA: Insufficient documentation

## 2013-05-29 DIAGNOSIS — Z8673 Personal history of transient ischemic attack (TIA), and cerebral infarction without residual deficits: Secondary | ICD-10-CM | POA: Insufficient documentation

## 2013-05-29 DIAGNOSIS — I951 Orthostatic hypotension: Principal | ICD-10-CM | POA: Diagnosis present

## 2013-05-29 DIAGNOSIS — K219 Gastro-esophageal reflux disease without esophagitis: Secondary | ICD-10-CM | POA: Insufficient documentation

## 2013-05-29 DIAGNOSIS — F3289 Other specified depressive episodes: Secondary | ICD-10-CM | POA: Insufficient documentation

## 2013-05-29 DIAGNOSIS — R55 Syncope and collapse: Secondary | ICD-10-CM | POA: Diagnosis present

## 2013-05-29 DIAGNOSIS — I129 Hypertensive chronic kidney disease with stage 1 through stage 4 chronic kidney disease, or unspecified chronic kidney disease: Secondary | ICD-10-CM | POA: Insufficient documentation

## 2013-05-29 HISTORY — DX: Syncope and collapse: R55

## 2013-05-29 HISTORY — DX: Cardiac arrhythmia, unspecified: I49.9

## 2013-05-29 LAB — CBC
Platelets: 208 10*3/uL (ref 150–400)
RBC: 3.02 MIL/uL — ABNORMAL LOW (ref 4.22–5.81)
RDW: 15.9 % — ABNORMAL HIGH (ref 11.5–15.5)
WBC: 5 10*3/uL (ref 4.0–10.5)

## 2013-05-29 LAB — BASIC METABOLIC PANEL
CO2: 23 mEq/L (ref 19–32)
Chloride: 98 mEq/L (ref 96–112)
GFR calc Af Amer: 44 mL/min — ABNORMAL LOW (ref >90)
Sodium: 133 mEq/L — ABNORMAL LOW (ref 135–145)

## 2013-05-29 LAB — TROPONIN I
Troponin I: <0.3 ng/mL (ref <0.30)
Troponin I: <0.3 ng/mL (ref <0.30)

## 2013-05-29 MED ORDER — ASPIRIN 81 MG PO TBEC: 81.0000 mg | DELAYED_RELEASE_TABLET | Freq: Every day | ORAL | Status: DC

## 2013-05-29 MED ORDER — MAGNESIUM 400 MG PO CAPS: 1.0000 | ORAL_CAPSULE | Freq: Every day | ORAL | Status: DC

## 2013-05-29 MED ORDER — MAGNESIUM OXIDE 400 (241.3 MG) MG PO TABS
400.0000 mg | ORAL_TABLET | Freq: Every day | ORAL | Status: DC
  Administered 2013-05-29 – 2013-05-30 (×2): 400 mg via ORAL
  Filled 2013-05-29 (×2): qty 1

## 2013-05-29 MED ORDER — PANTOPRAZOLE SODIUM 40 MG PO TBEC
40.0000 mg | DELAYED_RELEASE_TABLET | Freq: Every day | ORAL | Status: DC
  Administered 2013-05-29 – 2013-05-30 (×2): 40 mg via ORAL
  Filled 2013-05-29 (×2): qty 1

## 2013-05-29 MED ORDER — HYDROCODONE-ACETAMINOPHEN 5-325 MG PO TABS: 1.0000 | ORAL_TABLET | ORAL | Status: DC | PRN

## 2013-05-29 MED ORDER — SODIUM CHLORIDE 0.9 % IV SOLN: INTRAVENOUS | Status: DC

## 2013-05-29 MED ORDER — SODIUM CHLORIDE 0.9 % IV BOLUS (SEPSIS)
500.0000 mL | Freq: Once | INTRAVENOUS | Status: AC
  Administered 2013-05-29: 500 mL via INTRAVENOUS

## 2013-05-29 MED ORDER — POTASSIUM CHLORIDE CRYS ER 20 MEQ PO TBCR
20.0000 meq | EXTENDED_RELEASE_TABLET | Freq: Every day | ORAL | Status: DC
  Administered 2013-05-29 – 2013-05-30 (×2): 20 meq via ORAL
  Filled 2013-05-29 (×2): qty 1

## 2013-05-29 MED ORDER — DULOXETINE HCL 60 MG PO CPEP
60.0000 mg | ORAL_CAPSULE | Freq: Every day | ORAL | Status: DC
  Administered 2013-05-29 – 2013-05-30 (×2): 60 mg via ORAL
  Filled 2013-05-29 (×2): qty 1

## 2013-05-29 MED ORDER — ASPIRIN EC 81 MG PO TBEC
81.0000 mg | DELAYED_RELEASE_TABLET | Freq: Every day | ORAL | Status: DC
  Administered 2013-05-29 – 2013-05-30 (×2): 81 mg via ORAL
  Filled 2013-05-29 (×2): qty 1

## 2013-05-29 MED ORDER — SODIUM CHLORIDE 0.9 % IJ SOLN
3.0000 mL | Freq: Two times a day (BID) | INTRAMUSCULAR | Status: DC
  Administered 2013-05-29 – 2013-05-30 (×4): 3 mL via INTRAVENOUS

## 2013-05-29 NOTE — Progress Notes (Signed)
Utilization review completed.  

## 2013-05-29 NOTE — Consult Note (Addendum)
ELECTROPHYSIOLOGY CONSULT NOTE  Patient ID: Paul Fisher MRN: [id number], DOB/AGE: [birth year]   Admit date: 05/29/2013 Date of Consult: 05/29/2013  Primary Physician: [name], MD Primary Cardiologist: Herbert Deaner, MD at [facility] Reason for Consultation: Syncope  History of Present Illness Paul Fisher is a 79 y.o. male with history of recurrent syncope, nearly 20 episodes in 10 years, who was found to have sinus node dysfunction and bifascicular block and underwent PPM implant at [facility] in March 2012, followed by [name]. He has done well since that time. On the day of admission he was at home with his wife feeling like his usual self. He finished his exercise / rehab routine (which consists of walking several times up and down hallway at home) and sat down on couch to rest. He had been seated for 15 minutes. He then stood up to walk into the kitchen and while standing in the kitchen he began to "feel sick." He reports nausea and weakness. He states he felt like he was going to pass out. His wife states he was very pale and diaphoretic. She called her daughter in-law who lives next door for help and they lowered him to the floor. He did not injure himself. His wife reports he was unresponsive briefly, just a few seconds, with trembling noted. These symptoms are similar to his previous episodes. His wife states he has not been eating or drinking enough. He denies any recent illness, fever or chills. He denies any recent changes to his medications. 911 was called. He was then transported to [facility] and ultimately here for further evaluation. He also has h/o AAA s/p endovascular stent graft 2010, HTN, CKD, dyslipidemia, anemia and prostate CA.  On admission his 12-lead ECG shows SR with RBBB. Device interrogation shows normal PPM function with no arrhythmias correlating to episode. Orthostatics positive.   Past Medical History Past Medical History  Diagnosis Date  . Hyperlipidemia   . Depression    . Anxiety   . Cancer prostate  . Stroke   . Abdominal aortic aneurysm   . GERD (gastroesophageal reflux disease)   . Hypertension     dr [name]   [facility] [place]  . Pacemaker 09/2010    [name] [place]  . Chronic kidney disease     CKD, stage III (06/06/12)  . Aortic aneurysm   . Raynaud disease   . Dysrhythmia     patient with pacemaker   . Syncope and collapse 05/28/2013    with seizure like activity    Past Surgical History Past Surgical History  Procedure Laterality Date  . Prostate cancer seed implant    . Arotic stent  2010    Dr. Hart [place]  . Pacemaker insertion  01/12/2011    [facility] mod# [id number], serial # [id number]  . Tonsillectomy    . Ercp  04/25/2012    Procedure: ENDOSCOPIC RETROGRADE CHOLANGIOPANCREATOGRAPHY (ERCP);  Surgeon: Louis Meckel, MD;  Location: [facility] ORS;  Service: Gastroenterology;  Laterality: [name];  . Cystoscopy  2010    for eval of hematuria  . Cholecystectomy      07/10/2012  . Cholecystectomy  07/10/2012    Procedure: LAPAROSCOPIC CHOLECYSTECTOMY WITH INTRAOPERATIVE CHOLANGIOGRAM;  Surgeon: [name], MD;  Location: [facility] OR;  Service: General;  Laterality: [name];  . Ercp  07/11/2012    Procedure: ENDOSCOPIC RETROGRADE CHOLANGIOPANCREATOGRAPHY (ERCP);  Surgeon: Rachael Fee, MD;  Location: [facility] OR;  Service: Endoscopy;  Laterality: [name];  . Insert / replace /  remove pacemaker      [employer]    Allergies/Intolerances Allergies  Allergen Reactions  . Atenolol   . Penicillins Swelling    "knot on head" per wife  . Statins     Inpatient Medications . aspirin EC  81 mg Oral Daily  . DULoxetine  60 mg Oral Daily  . magnesium oxide  400 mg Oral Daily  . pantoprazole  40 mg Oral Daily  . potassium chloride SA  20 mEq Oral Daily  . sodium chloride  3 mL Intravenous Q12H    Family History Family History  Problem Relation Age of Onset  . Colon cancer Neg Hx   . Diabetes Father   . Kidney failure Mother     born with only one      Social History Social History  . Marital Status: Married   Occupational History  . retired    Social History Main Topics  . Smoking status: Former Smoker    Types: Cigarettes    Quit date: 05/05/1993  . Smokeless tobacco: Never Used  . Alcohol Use: Yes     Comment: 2 Drinks per day  . Drug Use: No   Review of Systems General: No chills, fever, night sweats or weight changes  Cardiovascular:  No chest pain, dyspnea on exertion, edema, orthopnea, palpitations, paroxysmal nocturnal dyspnea Dermatological: No rash, lesions or masses Respiratory: No cough, dyspnea Urologic: No hematuria, dysuria Abdominal: No nausea, vomiting, diarrhea, bright red blood per rectum, melena, or hematemesis Neurologic: No visual changes, weakness, changes in mental status All other systems reviewed and are otherwise negative except as noted above.  Physical Exam Vitals: Blood pressure 143/65, pulse 75, temperature 98.1 F (36.7 C), temperature source Oral, resp. rate 17, height 5\' 10"  (1.778 m), weight 162 lb 11.2 oz (73.8 kg), SpO2 97.00%.  General: Well developed, well appearing 79 y.o. male in no acute distress. HEENT: Normocephalic, atraumatic. EOMs intact. Sclera nonicteric. Oropharynx clear.  Neck: Supple without bruits. No JVD. Lungs: Respirations regular and unlabored, CTA bilaterally. No wheezes, rales or rhonchi. Heart: RRR. S1, S2 present. No murmurs, rub, S3 or S4. Abdomen: Soft, non-tender, non-distended. BS present x 4 quadrants. No hepatosplenomegaly.  Extremities: No clubbing, cyanosis or edema. DP/PT/Radials 2+ and equal bilaterally. Psych: Normal affect. Neuro: Alert and oriented X 3. Moves all extremities spontaneously. Musculoskeletal: No kyphosis. Skin: Intact. Warm and dry. No rashes or petechiae in exposed areas.   Labs  Recent Labs  05/29/13 0130 05/29/13 0845  TROPONINI <0.30 <0.30   Lab Results  Component Value Date   WBC 5.0 05/29/2013   HGB 8.8* 05/29/2013    HCT 26.6* 05/29/2013   MCV 88.1 05/29/2013   PLT 208 05/29/2013    Recent Labs Lab 05/29/13 0130  NA 133*  K 4.3  CL 98  CO2 23  BUN 25*  CREATININE 1.56*  CALCIUM 8.9  GLUCOSE 138*    Radiology/Studies Ct Angio Abd/pel W/ And/or W/o 05/21/2013    IMPRESSION:  1.  Patent infrarenal bifurcated stent graft with persistent type 2 endoleak, minimal continued enlargement of native aneurysm sac. 2.  Descending and sigmoid diverticulosis.   Original Report Authenticated By: D. Andria Rhein, MD    Echocardiogram  pending  12-lead ECG on admission shows SR with RBBB Telemetry shows SR with occasional PVCs Device interrogation performed by industry today shows normal PPM function with stable lead measurements; few episodes of brief, nonsustained atrial tachycardia; no arrhythmias documented at time of syncope; no evidence of  ventricular arrhythmias >170 bpm  Orthostatics today BP and pulse lying - 159/73  95 BP and pulse sitting - 139/84  80 BP and pulse standing - 136/67  88   Assessment and Plan 1. Orthostatic syncope 2. Sinus node dysfunction with bifascicular block s/p PPM implant 2012 3. PVD / AAA s/p endovascular stent graft 2010  Mr. Fredin presents with syncope which occurred with prodrome of nausea, weakness, pallor and diaphoresis. His vital signs are positive for orthostatic hypotension. He is not having any symptoms worrisome for angina/ACS. His PPM function is normal. His echo is pending and we will review once available. We recommend avoiding antihypertensive medications. He and his family were counseled regarding the importance of adequate hydration, mobility support and recognition of symptoms to avoid injury. He will continue cardiac/EP follow-[facility] at [facility].   Dr. Gala Romney to see. Please see recommendations below.  Limmie Patricia, PA-C 05/29/2013, 3:54 PM  Patient seen and examined with Rick Duff, PA-C. We discussed all aspects of the encounter. I agree with  the assessment and plan as stated above. Pacemaker interrogation reviewed - no etiology for syncope. Syncope likely related to orthostasis. Encouraged to increase fluid and salt intake. Get up slowly. Will check echo. If no further symptoms can d/c home in am. Will give 500cc IV as he is still mildly orthostatic on exam.  Truman Hayward 4:57 PM

## 2013-05-29 NOTE — H&P (Signed)
[facility] History and Physical  Paul Fisher EAV:[id number] DOB: 1924/03/13 DOA: 05/29/2013  Referring physician: ED PCP: [name], MD  Chief Complaint: Syncope  HPI: Paul Fisher is a 79 y.o. male who presents to the ED at [facility] after a syncopal episode which occurred at home.  Episode was witnessed by family members.  Patient had just recently stood up off of couch (which he had sat down on after doing exercise), he told his wife he felt ill, backed up to the wall, turned white and wife helped patient to the ground as he then proceeded to pass out.  He remained unconscious for 15-20 seconds with some seizure like activity.  Patient had a pacemaker placed between 2-3 years ago as he had been having very similar episodes very frequently prior to that but they decreased significantly since placement of pacemaker.  Admits since that time have been unable to determine a clear and obvious cause of syncope.  In the ED his orthostatic vital signs were unremarkable.  The remainder of his work up is essentially at or near his baseline including anemia with HGB of 9.0, creatinine of 1.8.  Because the patient has had all of his recent AAA work here at [facility], family requested transfer to [facility] for the syncope work up and admission.  Review of Systems: 12 systems reviewed and otherwise negative.  Past Medical History  Diagnosis Date  . Hyperlipidemia   . Depression   . Anxiety   . Cancer prostate  . Stroke   . Abdominal aortic aneurysm   . GERD (gastroesophageal reflux disease)   . Hypertension     dr [name]   [place]  . Pacemaker 09/2010    [name] [place]  . Chronic kidney disease     CKD, stage III (06/06/12)  . Aortic aneurysm   . Raynaud disease    Past Surgical History  Procedure Laterality Date  . Prostate cancer seed implant    . Arotic stent  2010    Dr. Hart [place]  . Pacemaker insertion  01/12/2011    [facility] mod# [id number], serial # [id number]  .  Tonsillectomy    . Ercp  04/25/2012    Procedure: ENDOSCOPIC RETROGRADE CHOLANGIOPANCREATOGRAPHY (ERCP);  Surgeon: Louis Meckel, MD;  Location: [facility] ORS;  Service: Gastroenterology;  Laterality: [name];  . Insert / replace / remove pacemaker      [employer]  . Cystoscopy  2010    for eval of hematuria  . Cholecystectomy      07/10/2012  . Cholecystectomy  07/10/2012    Procedure: LAPAROSCOPIC CHOLECYSTECTOMY WITH INTRAOPERATIVE CHOLANGIOGRAM;  Surgeon: [name], MD;  Location: [facility] OR;  Service: General;  Laterality: [name];  . Ercp  07/11/2012    Procedure: ENDOSCOPIC RETROGRADE CHOLANGIOPANCREATOGRAPHY (ERCP);  Surgeon: Rachael Fee, MD;  Location: [facility] OR;  Service: Endoscopy;  Laterality: [name];   Social History:  reports that he quit smoking about 20 years ago. His smoking use included Cigarettes. He smoked 0.00 packs per day. He has never used smokeless tobacco. He reports that  drinks alcohol. He reports that he does not use illicit drugs.   Allergies  Allergen Reactions  . Penicillins Swelling    "knot on head" per wife    Family History  Problem Relation Age of Onset  . Colon cancer Neg Hx   . Diabetes Father   . Kidney failure Mother     born with only one   Prior to  Admission medications   Medication Sig Start Date End Date Taking? Authorizing Provider  aspirin 81 MG EC tablet Take 81 mg by mouth daily.      Historical Provider, MD  Cholecalciferol (VITAMIN D-3 PO) Take 1,000 Units by mouth daily.    Historical Provider, MD  Cyanocobalamin (VITAMIN B-12 IJ) Inject as directed every 30 (thirty) days.     Historical Provider, MD  DULoxetine (CYMBALTA) 60 MG capsule Take 60 mg by mouth daily.      Historical Provider, MD  esomeprazole (NEXIUM) 20 MG capsule Take 40 mg by mouth daily before breakfast.    Historical Provider, MD  HYDROcodone-acetaminophen (NORCO/VICODIN) 5-325 MG per tablet Take 1-2 tablets by mouth every 4 (four) hours as needed. 07/12/12   [name], MD  Magnesium 400 MG CAPS Take 1 capsule by mouth daily.     Historical Provider, MD  potassium chloride SA (K-DUR,KLOR-CON) 20 MEQ tablet Take 20 mEq by mouth daily.    Historical Provider, MD  Probiotic Product (ALIGN PO) Take 1 tablet by mouth daily.    Historical Provider, MD  ramipril (ALTACE) 5 MG capsule Take 5 mg by mouth daily.    Historical Provider, MD   Physical Exam: There were no vitals filed for this visit.  General:  NAD, resting comfortably in bed Eyes: PEERLA EOMI ENT: mucous membranes moist Neck: supple w/o JVD Cardiovascular: RRR w/o MRG Respiratory: CTA B Abdomen: soft, nt, nd, bs+ Skin: no rash nor lesion Musculoskeletal: MAE, full ROM all 4 extremities Psychiatric: normal tone and affect Neurologic: AAOx3, grossly non-focal  Labs on Admission:  Basic Metabolic Panel: No results found for this basename: NA, K, CL, CO2, GLUCOSE, BUN, CREATININE, CALCIUM, MG, PHOS,  in the last 168 hours Liver Function Tests: No results found for this basename: AST, ALT, ALKPHOS, BILITOT, PROT, ALBUMIN,  in the last 168 hours No results found for this basename: LIPASE, AMYLASE,  in the last 168 hours No results found for this basename: AMMONIA,  in the last 168 hours CBC: No results found for this basename: WBC, NEUTROABS, HGB, HCT, MCV, PLT,  in the last 168 hours Cardiac Enzymes: No results found for this basename: CKTOTAL, CKMB, CKMBINDEX, TROPONINI,  in the last 168 hours  BNP (last 3 results) No results found for this basename: PROBNP,  in the last 8760 hours CBG: No results found for this basename: GLUCAP,  in the last 168 hours  Radiological Exams on Admission: No results found.  EKG: Ordered and pending here, but one from [place] shows bifascicular block which is probably old.  Assessment/Plan Principal Problem:   Syncope Active Problems:   AAA (abdominal aortic aneurysm)   1. Syncope - history very suspicious for orthostatic hypotension but  orthostatic vitals negative at [facility] (repeating here).  Given prodrome, sudden cardiac arrythmia seems less likely, though day team will likely see if they can get cardiology to interrogate his pacemaker while here.  Serial troponins ordered, certainly with his vascular disease history he is at high risk for CAD, but having no symptoms of this at present time.  I doubt his AAA with type 2 endoleak is playing a role given that the patient is hemodynamically stable at this time.  If patients orthostatic vitals are positive then gentle hydration.  Tele monitor, and likely needs cards eval in AM, no BP meds to hold except lisinopril.    Code Status: Full Code (must indicate code status--if unknown or must be presumed, indicate so) Family  Communication: No family in room (indicate person spoken with, if applicable, with phone number if by telephone) Disposition Plan: Admit to obs (indicate anticipated LOS)  Time spent: 70 min  GARDNER, JARED M. [facility] Pager [phone]  If 7PM-7AM, please contact night-coverage [email or web address] Password [email or web address] 05/29/2013, 2:11 AM

## 2013-05-29 NOTE — Progress Notes (Signed)
[facility] PROGRESS NOTE  Paul Fisher ZOX:[id number] DOB: [birth year] DOA: 05/29/2013 PCP: [name], MD  HPI/Subjective: Wife and daughter-in-law at bedside, denies any dizziness  Assessment/Plan:  Syncope -Positive for orthostatic hypotension, last night at 1 AM this BP went down from 159 to 136. -Family mentioned seizure-like activity which is consistent with low blood pressure. -Patient used to get several syncopal episodes prior to the placement of the pacemaker. -Cardiology consultation requested to rule out any arrhythmias. -Hydrate with IV fluids, check orthostatic vitals in the morning. -2-D echocardiogram is pending.  CKD stage III -Patient is at baseline.  AAA -Patient also Dr. Hart [place], has very slight increase in history of bloody size since last imaging. -Recommendation to repeat CT angio in 9 months.  Hypertension -Reasonably controlled, patient is not on any blood pressure medications. -Needs aggressive control because of concurrent AAA.  Code Status: Full Code Family Communication: Plan discussed with the patient. Disposition Plan: Remains inpatient   Consultants:  Cards  Procedures:  None  Antibiotics:  None   Objective: Filed Vitals:   05/29/13 1300  BP: 143/65  Pulse: 75  Temp: 98.1 F (36.7 C)  Resp: 17    Intake/Output Summary (Last 24 hours) at 05/29/13 1526 Last data filed at 05/29/13 1500  Gross per 24 hour  Intake    220 ml  Output    700 ml  Net   -480 ml   Filed Weights   05/29/13 0100  Weight: 73.8 kg (162 lb 11.2 oz)    Exam: General: Alert and awake, oriented x3, not in any acute distress. HEENT: anicteric sclera, pupils reactive to light and accommodation, EOMI CVS: S1-S2 clear, no murmur rubs or gallops Chest: clear to auscultation bilaterally, no wheezing, rales or rhonchi Abdomen: soft nontender, nondistended, normal bowel sounds, no organomegaly Extremities: no cyanosis, clubbing or edema noted  bilaterally Neuro: Cranial nerves II-XII intact, no focal neurological deficits  Data Reviewed: Basic Metabolic Panel:  Recent Labs Lab 05/29/13 0130  NA 133*  K 4.3  CL 98  CO2 23  GLUCOSE 138*  BUN 25*  CREATININE 1.56*  CALCIUM 8.9   Liver Function Tests: No results found for this basename: AST, ALT, ALKPHOS, BILITOT, PROT, ALBUMIN,  in the last 168 hours No results found for this basename: LIPASE, AMYLASE,  in the last 168 hours No results found for this basename: AMMONIA,  in the last 168 hours CBC:  Recent Labs Lab 05/29/13 0130  WBC 5.0  HGB 8.8*  HCT 26.6*  MCV 88.1  PLT 208   Cardiac Enzymes:  Recent Labs Lab 05/29/13 0130 05/29/13 0845  TROPONINI <0.30 <0.30   BNP (last 3 results) No results found for this basename: PROBNP,  in the last 8760 hours CBG: No results found for this basename: GLUCAP,  in the last 168 hours  Micro No results found for this or any previous visit (from the past 240 hour(s)).   Studies: No results found.  Scheduled Meds: . aspirin EC  81 mg Oral Daily  . DULoxetine  60 mg Oral Daily  . magnesium oxide  400 mg Oral Daily  . pantoprazole  40 mg Oral Daily  . potassium chloride SA  20 mEq Oral Daily  . sodium chloride  3 mL Intravenous Q12H   Continuous Infusions:   Principal Problem:   Syncope Active Problems:   AAA (abdominal aortic aneurysm)    Time spent: 35 minutes    Ms [facility] A  [facility] Pager [phone] If 7PM-7AM,  please contact night-coverage at [email or web address], password [facility] 05/29/2013, 3:26 PM  LOS: 0 days

## 2013-05-30 DIAGNOSIS — I10 Essential (primary) hypertension: Secondary | ICD-10-CM

## 2013-05-30 DIAGNOSIS — I714 Abdominal aortic aneurysm, without rupture: Secondary | ICD-10-CM

## 2013-05-30 DIAGNOSIS — I517 Cardiomegaly: Secondary | ICD-10-CM

## 2013-05-30 DIAGNOSIS — R55 Syncope and collapse: Secondary | ICD-10-CM

## 2013-05-30 LAB — CBC
HCT: 27.4 % — ABNORMAL LOW (ref 39.0–52.0)
MCH: 29 pg (ref 26.0–34.0)
MCHC: 32.5 g/dL (ref 30.0–36.0)
Platelets: 228 10*3/uL (ref 150–400)
WBC: 4.9 10*3/uL (ref 4.0–10.5)

## 2013-05-30 LAB — BASIC METABOLIC PANEL
CO2: 24 mEq/L (ref 19–32)
Chloride: 102 mEq/L (ref 96–112)
Creatinine, Ser: 1.61 mg/dL — ABNORMAL HIGH (ref 0.50–1.35)
Glucose, Bld: 126 mg/dL — ABNORMAL HIGH (ref 70–99)

## 2013-05-30 NOTE — Progress Notes (Signed)
   Subjective:  Patient feeling well. No further dizzy spells. Has not yet been out of bed. Echo not yet done.  Objective:  Vital Signs in the last 24 hours: Temp:  [98.1 F (36.7 C)-98.7 F (37.1 C)] 98.3 F (36.8 C) (09/04 0524) Pulse Rate:  [74-115] 115 (09/04 0526) Resp:  [17-18] 18 (09/04 0524) BP: (119-144)/(65-80) 121/66 mmHg (09/04 0526) SpO2:  [94 %-97 %] 94 % (09/04 0524)  Intake/Output from previous day: 09/03 0701 - 09/04 0700 In: 580 [P.O.:580] Out: 450 [Urine:450] Intake/Output from this shift:    . aspirin EC  81 mg Oral Daily  . DULoxetine  60 mg Oral Daily  . magnesium oxide  400 mg Oral Daily  . pantoprazole  40 mg Oral Daily  . potassium chloride SA  20 mEq Oral Daily  . sodium chloride  3 mL Intravenous Q12H      Physical Exam: The patient appears to be in no distress.  Head and neck exam reveals that the pupils are equal and reactive.  The extraocular movements are full.  There is no scleral icterus.  Mouth and pharynx are benign.  No lymphadenopathy.  No carotid bruits.  The jugular venous pressure is normal.  Thyroid is not enlarged or tender.  Chest is clear to percussion and auscultation.  No rales or rhonchi.  Expansion of the chest is symmetrical.  Heart reveals no abnormal lift or heave.  First and second heart sounds are normal.  There is no murmur gallop rub or click.  The abdomen is soft and nontender.  Bowel sounds are normoactive.  There is no hepatosplenomegaly or mass.  There are no abdominal bruits.  Extremities reveal no phlebitis or edema.  Pedal pulses are good.  There is no cyanosis or clubbing.  Neurologic exam is normal strength and no lateralizing weakness.  No sensory deficits.  Integument reveals no rash  Lab Results:  Recent Labs  05/29/13 0130 05/30/13 0500  WBC 5.0 4.9  HGB 8.8* 8.9*  PLT 208 228    Recent Labs  05/29/13 0130 05/30/13 0500  NA 133* 138  K 4.3 4.7  CL 98 102  CO2 23 24  GLUCOSE 138*  126*  BUN 25* 22  CREATININE 1.56* 1.61*    Recent Labs  05/29/13 0845 05/29/13 1520  TROPONINI <0.30 <0.30   Hepatic Function Panel No results found for this basename: PROT, ALBUMIN, AST, ALT, ALKPHOS, BILITOT, BILIDIR, IBILI,  in the last 72 hours No results found for this basename: CHOL,  in the last 72 hours No results found for this basename: PROTIME,  in the last 72 hours  Imaging: No results found.  Cardiac Studies: Telemetry shows NSR with occasional short run of SVT overnight. Assessment/Plan:  1. Orthostatic syncope  2. Sinus node dysfunction with bifascicular block s/p PPM implant 2012  3. PVD / AAA s/p endovascular stent graft 2010  Plan: Await results of echo. Ambulate with assistance in hall. If no further Sx can probably be discharged later today with instructions to remain well hydrated.  LOS: 1 day    Cassell Clement 05/30/2013, 8:16 AM

## 2013-05-30 NOTE — Progress Notes (Signed)
\  Echocardiogram 2D Echocardiogram has been performed.  Paul Fisher 05/30/2013, 12:39 PM

## 2013-05-30 NOTE — Evaluation (Signed)
Physical Therapy Evaluation Patient Details Name: Paul Fisher MRN: [id number] DOB: [birth year] Today's Date: 05/30/2013 Time: 8413-2440 PT Time Calculation (min): 28 min  PT Assessment / Plan / Recommendation History of Present Illness  Pt adm with syncope probably due to orthostatic hypotension.  Clinical Impression  Pt with slight decrease in mobility due to 2 days without mobilizing.  Per son and pt, pt is close to baseline and family is able to assist with needs at home.  Pt/family comfortable with activity progression at home since he has received HHPT in the past.  Pt's family also able to assist him with ADL's as needed and do not feel OT eval needed. Ready for dc from my standpoint.     PT Assessment  Patent does not need any further PT services    Follow Up Recommendations  No PT follow up    Does the patient have the potential to tolerate intense rehabilitation      Barriers to Discharge        Equipment Recommendations  None recommended by PT    Recommendations for Other Services     Frequency      Precautions / Restrictions Precautions Precautions: Fall   Pertinent Vitals/Pain See flow sheet.      Mobility  Bed Mobility Bed Mobility: Supine to Sit;Sitting - Scoot to Edge of Bed Supine to Sit: 4: Min assist;HOB elevated Sitting - Scoot to Edge of Bed: 5: Supervision Details for Bed Mobility Assistance: Assist to bring trunk up. Transfers Transfers: Sit to Stand;Stand to Sit Sit to Stand: 4: Min assist;With upper extremity assist;From bed Stand to Sit: 4: Min guard;With upper extremity assist;With armrests;To chair/3-in-1 Details for Transfer Assistance: Assist to raise hips. Ambulation/Gait Ambulation/Gait Assistance: 4: Min guard;5: Supervision Ambulation Distance (Feet): 100 Feet Assistive device: 4-wheeled walker Ambulation/Gait Assistance Details: verbal cues to stand more erect Gait Pattern: Step-through pattern;Decreased stride length;Trunk  flexed Gait velocity: decr    Exercises     PT Diagnosis:    PT Problem List:   PT Treatment Interventions:       PT Goals(Current goals can be found in the care plan section) Acute Rehab PT Goals Patient Stated Goal: go home PT Goal Formulation: No goals set, d/c therapy  Visit Information  Last PT Received On: 05/30/13 Assistance Needed: +1 History of Present Illness: Pt adm with syncope probably due to orthostatic hypotension.       Prior Functioning  Home Living Family/patient expects to be discharged to:: Private residence Living Arrangements: Spouse/significant other Available Help at Discharge: Family;Available 24 hours/day Type of Home: House Home Access: Stairs to enter [employer] of Steps: 2 Entrance Stairs-Rails: Right Home Layout: One level Home Equipment: Walker - 4 wheels Prior Function Level of Independence: Needs assistance Gait / Transfers Assistance Needed: Assist for amb, transfers, and bed mobility at times. Communication Communication: HOH Dominant Hand: Right    Cognition  Cognition Arousal/Alertness: Awake/alert Behavior During Therapy: WFL for tasks assessed/performed Overall Cognitive Status: Within Functional Limits for tasks assessed    Extremity/Trunk Assessment Upper Extremity Assessment Upper Extremity Assessment: Generalized weakness Lower Extremity Assessment Lower Extremity Assessment: Generalized weakness   Balance Balance Balance Assessed: Yes Static Standing Balance Static Standing - Balance Support: Bilateral upper extremity supported Static Standing - Level of Assistance: 5: Stand by assistance  End of Session PT - End of Session Equipment Utilized During Treatment: Gait belt Activity Tolerance: Patient tolerated treatment well Patient left: in chair;with family/visitor present;with call bell/phone within reach  Nurse Communication: Mobility status  GP     Paul Fisher 05/30/2013, 2:38 PM  [facility]  PT [phone]

## 2013-05-30 NOTE — Progress Notes (Signed)
Resting in bed with eyes closed, no acute distress noted.  Resp. even and regular, call light in reach.

## 2013-05-30 NOTE — Discharge Summary (Signed)
Physician Discharge Summary  Paul Fisher AOZ:[id number] DOB: [birth year] DOA: 05/29/2013  PCP: [name], MD  Admit date: 05/29/2013 Discharge date: 05/30/2013  Time spent: 40 minutes  Recommendations for Outpatient Follow-up:  1. Followup with primary care physician as outpatient.  Discharge Diagnoses:  Principal Problem:   Syncope Active Problems:   AAA (abdominal aortic aneurysm)   Hypertension   CKD (chronic kidney disease), stage III   Orthostatic hypotension   Discharge Condition: Stable  Diet recommendation: Heart healthy diet  Filed Weights   05/29/13 0100  Weight: 73.8 kg (162 lb 11.2 oz)    History of present illness:  Paul Fisher is a 79 y.o. male who presents to the ED at [facility] after a syncopal episode which occurred at home. Episode was witnessed by family members. Patient had just recently stood up off of couch (which he had sat down on after doing exercise), he told his wife he felt ill, backed up to the wall, turned white and wife helped patient to the ground as he then proceeded to pass out. He remained unconscious for 15-20 seconds with some seizure like activity. Patient had a pacemaker placed between 2-3 years ago as he had been having very similar episodes very frequently prior to that but they decreased significantly since placement of pacemaker. Admits since that time have been unable to determine a clear and obvious cause of syncope.  In the ED his orthostatic vital signs were unremarkable. The remainder of his work up is essentially at or near his baseline including anemia with HGB of 9.0, creatinine of 1.8. Because the patient has had all of his recent AAA work here at [facility], family requested transfer to [facility] for the syncope work up and admission.  Hospital Course:   1. Syncope: Likely secondary to orthostatic hypotension, patient started his very consistent with orthostatic hypotension, systolic blood pressure went down from 159 to 136. Family  mention seizure-like activity which is consistent with neurocardiogenic syncope. Family mention also that he had several syncopal episodes prior to the placement of the pacemaker, so cardiology was consulted. His pacemaker was interrogated and showed no events, patient hydrated with IV fluids orthostatic was checked today it was negative, patient was evaluated by PT and recommended no followup. 2-D echocardiogram showed no wall motion abnormalities, ejection fraction about 55% with normal right ventricle function.  2. CKD stage III: Patient baseline, no changes were done to his medications.  3. AAA: Patient follows with Dr. Hart Rochester, status post endovascular stent, less assessment showed slight enlargement and endoleak. Recommended CT angiogram and 9 month for followup.  Procedures:  2-D echo: Impressions:  - Technically difficult study with poor acoustic windows. Normal LV size and systolic function, EF 55%. Normal RV size and systolic function. No significant valvular abnormalities.  Consultations:  Cardiology  Discharge Exam: Filed Vitals:   05/30/13 1414  BP: 128/63  Pulse: 109  Temp:   Resp:   General: Alert and awake, oriented x3, not in any acute distress. HEENT: anicteric sclera, pupils reactive to light and accommodation, EOMI CVS: S1-S2 clear, no murmur rubs or gallops Chest: clear to auscultation bilaterally, no wheezing, rales or rhonchi Abdomen: soft nontender, nondistended, normal bowel sounds, no organomegaly Extremities: no cyanosis, clubbing or edema noted bilaterally Neuro: Cranial nerves II-XII intact, no focal neurological deficits   Discharge Instructions  Discharge Orders   Future Orders Complete By Expires   Diet - low sodium heart healthy  As directed    Increase activity slowly  As directed        Medication List         ALIGN PO  Take 1 tablet by mouth daily.     aspirin 81 MG EC tablet  Take 81 mg by mouth daily.     CULTURELLE Caps   Take 1 capsule by mouth daily.     CYANOCOBALAMIN IJ  Inject 1 application as directed every 30 (thirty) days.     DULoxetine 60 MG capsule  Commonly known as:  CYMBALTA  Take 60 mg by mouth daily.     esomeprazole 20 MG capsule  Commonly known as:  NEXIUM  Take 40 mg by mouth daily before breakfast.     famotidine 10 MG tablet  Commonly known as:  PEPCID  Take 10 mg by mouth daily.     Magnesium 400 MG Caps  Take 1 capsule by mouth daily.     potassium chloride SA 20 MEQ tablet  Commonly known as:  K-DUR,KLOR-CON  Take 20 mEq by mouth daily.     VITAMIN D-3 PO  Take 1,000 Units by mouth daily.       Allergies  Allergen Reactions  . Atenolol   . Penicillins Swelling    "knot on head" per wife  . Statins        Follow-up Information   Follow up with [facility], MD In 1 week.   Specialty:  Internal Medicine   Contact information:   [address] [facility] [place] [address] [phone]        The results of significant diagnostics from this hospitalization (including imaging, microbiology, ancillary and laboratory) are listed below for reference.    Significant Diagnostic Studies: Ct Angio Abd/pel W/ And/or W/o  05/21/2013   *RADIOLOGY REPORT*  Clinical Data:  Aortic aneurysm, poststent graft placement  CT ANGIOGRAPHY ABDOMEN AND PELVIS  Technique:  Multidetector CT imaging of the abdomen and pelvis was performed using the standard protocol during bolus administration of intravenous contrast.  Multiplanar reconstructed images including MIPs were obtained and reviewed to evaluate the vascular anatomy.  Contrast: 60mL OMNIPAQUE IOHEXOL 350 MG/ML SOLN  Comparison:  05/22/2012  Arterial findings: Aorta:                  Moderate calcified plaque in the visualized distal descending thoracic and suprarenal segments.  Patent bifurcated   infrarenal stent graft stable in position.  Persistent type 2 endoleak related to patent lumbar  vessels.  Native aneurysm sac maximal transverse diameter 6.4 cm (previously 6.3 cm by my measurement of the same level), 4.8 x 5.9 cm at the level of previous reported measurement of 4.7 x 5.7 cm. Streak artifact from embolization material in the native aneurysm sac again noted.  Celiac axis:            Patent, unremarkable distal branching  Superior mesenteric:Nonocclusive calcified ostial plaque.  Replaced right hepatic arterial supply, an anatomic variant.  Patent distally.  Left renal:             Single, with partially calcified nonocclusive ostial plaque, patent distally.  Right renal:            Single, with eccentric partially calcified plaque extending over a length of approximate 2 cm from the ostium without high-grade stenosis.  Patent distally.  Inferior mesenteric:Origin embolization.  Reconstituted distally by visceral collaterals.  Left iliac:             Left limb of  the stent graft extends to the distal common iliac, without endoleak or aneurysm.  There is patchy calcified nonocclusive plaque in the external and internal iliac branches without aneurysm or stenosis.  Right iliac:            Right limb of the stent graft extends to the distal common iliac without aneurysm or endoleak.  Patchy calcified nonocclusive plaque in the external and internal iliac branches without aneurysm or stenosis.  Venous findings:  Patent hepatic veins, portal vein, superior mesenteric vein, splenic vein, bilateral renal veins, and IVC.   Review of the MIP images confirms the above findings.  Nonvascular findings: Coarse linear scarring or atelectasis in the visualized lung bases.  Transvenous pacing leads are partially seen.  Surgical clips in the gallbladder fossa.  Unremarkable liver, spleen, adrenal glands.  Moderate pancreatic parenchymal atrophy with coarse calcifications in the pancreatic head suggesting chronic pancreatitis.  Small probable cysts in the lower pole right kidney and interpolar region left kidney  as before.  No solid renal lesion or hydronephrosis.  Stomach, small bowel, and colon are nondilated.  Innumerable distal descending and sigmoid diverticula without adjacent inflammatory/edematous change. Urinary bladder incompletely distended.  Multiple metallic seeds in the prostate. Bilateral pelvic phleboliths.  No ascites.  No free air. Mild lumbar scoliosis with multilevel degenerative changes.  IMPRESSION:  1.  Patent infrarenal bifurcated stent graft with persistent type 2 endoleak, minimal continued enlargement of native aneurysm sac. 2.  Descending and sigmoid diverticulosis.   Original Report Authenticated By: D. Andria Rhein, MD    Microbiology: No results found for this or any previous visit (from the past 240 hour(s)).   Labs: Basic Metabolic Panel:  Recent Labs Lab 05/29/13 0130 05/30/13 0500  NA 133* 138  K 4.3 4.7  CL 98 102  CO2 23 24  GLUCOSE 138* 126*  BUN 25* 22  CREATININE 1.56* 1.61*  CALCIUM 8.9 9.0   Liver Function Tests: No results found for this basename: AST, ALT, ALKPHOS, BILITOT, PROT, ALBUMIN,  in the last 168 hours No results found for this basename: LIPASE, AMYLASE,  in the last 168 hours No results found for this basename: AMMONIA,  in the last 168 hours CBC:  Recent Labs Lab 05/29/13 0130 05/30/13 0500  WBC 5.0 4.9  HGB 8.8* 8.9*  HCT 26.6* 27.4*  MCV 88.1 89.3  PLT 208 228   Cardiac Enzymes:  Recent Labs Lab 05/29/13 0130 05/29/13 0845 05/29/13 1520  TROPONINI <0.30 <0.30 <0.30   BNP: BNP (last 3 results) No results found for this basename: PROBNP,  in the last 8760 hours CBG: No results found for this basename: GLUCAP,  in the last 168 hours     Signed:  Kialee Kham A  [facility] 05/30/2013, 3:12 PM

## 2013-05-30 NOTE — Progress Notes (Signed)
Utilization review completed.  

## 2013-05-31 NOTE — Progress Notes (Signed)
PT Note Late G-code entry   05/30/13 1442  PT G-Codes **NOT FOR INPATIENT CLASS**  Functional Assessment Tool Used clinical judgement  Functional Limitation Mobility: Walking and moving around  Mobility: Walking and Moving Around Current Status [phone]) CI  Mobility: Walking and Moving Around Goal Status [phone]) CI  Mobility: Walking and Moving Around Discharge Status [phone]) CI   [facility] PT [phone]

## 2013-07-10 ENCOUNTER — Other Ambulatory Visit: Payer: Self-pay

## 2013-07-10 DIAGNOSIS — I714 Abdominal aortic aneurysm, without rupture: Secondary | ICD-10-CM

## 2013-07-17 ENCOUNTER — Telehealth: Payer: Self-pay | Admitting: Vascular Surgery

## 2013-07-17 NOTE — Telephone Encounter (Signed)
Patient's wife called to notify us that patient will have his labs prior to CTA drawn at Dr. Kendal Hymen office in [facility] "Like before" and patient will let them know to send results to [facility] prior to his test.

## 2014-02-10 ENCOUNTER — Encounter: Payer: Self-pay | Admitting: Vascular Surgery

## 2014-02-10 ENCOUNTER — Other Ambulatory Visit: Payer: Self-pay | Admitting: *Deleted

## 2014-02-10 DIAGNOSIS — Z0189 Encounter for other specified special examinations: Secondary | ICD-10-CM

## 2014-02-11 ENCOUNTER — Ambulatory Visit
Admission: RE | Admit: 2014-02-11 | Discharge: 2014-02-11 | Disposition: A | Discharge status: Home / Self Care | Payer: Medicare Other | Source: Ambulatory Visit | Attending: Vascular Surgery | Admitting: Vascular Surgery

## 2014-02-11 ENCOUNTER — Ambulatory Visit: Payer: Medicare Other | Admitting: Vascular Surgery

## 2014-02-11 DIAGNOSIS — I714 Abdominal aortic aneurysm, without rupture, unspecified: Secondary | ICD-10-CM

## 2014-02-11 MED ORDER — IOHEXOL 350 MG/ML SOLN
80.0000 mL | Freq: Once | INTRAVENOUS | Status: AC | PRN
  Administered 2014-02-11: 80 mL via INTRAVENOUS

## 2014-08-26 DEATH — deceased

## 2014-09-01 ENCOUNTER — Telehealth: Payer: Self-pay | Admitting: Vascular Surgery

## 2014-09-01 NOTE — Telephone Encounter (Signed)
Doris called to let us know her husband passed away . She did not give me date when. We need to cancel his appointment in May 2016.   Thanks, Ebony Hail

## 2015-02-24 ENCOUNTER — Other Ambulatory Visit (HOSPITAL_COMMUNITY): Payer: Medicare Other

## 2015-02-24 ENCOUNTER — Ambulatory Visit: Payer: Medicare Other | Admitting: Vascular Surgery
